# Patient Record
Sex: Female | Born: 1955
Health system: Southern US, Community
[De-identification: ages and names within clinical notes are randomized; demographics above are authoritative.]

## PROBLEM LIST (undated history)

## (undated) DIAGNOSIS — K08409 Partial loss of teeth, unspecified cause, unspecified class: Secondary | ICD-10-CM

## (undated) DIAGNOSIS — I8393 Asymptomatic varicose veins of bilateral lower extremities: Secondary | ICD-10-CM

## (undated) DIAGNOSIS — M8580 Other specified disorders of bone density and structure, unspecified site: Secondary | ICD-10-CM

## (undated) DIAGNOSIS — T7840XA Allergy, unspecified, initial encounter: Secondary | ICD-10-CM

## (undated) DIAGNOSIS — D689 Coagulation defect, unspecified: Secondary | ICD-10-CM

## (undated) DIAGNOSIS — Z9889 Other specified postprocedural states: Secondary | ICD-10-CM

## (undated) DIAGNOSIS — Z90710 Acquired absence of both cervix and uterus: Secondary | ICD-10-CM

## (undated) HISTORY — DX: Other specified disorders of bone density and structure, unspecified site: M85.80

## (undated) HISTORY — DX: Asymptomatic varicose veins of bilateral lower extremities: I83.93

## (undated) HISTORY — DX: Acquired absence of both cervix and uterus: Z90.710

## (undated) HISTORY — DX: Other specified postprocedural states: Z98.890

## (undated) HISTORY — DX: Coagulation defect, unspecified: D68.9

## (undated) HISTORY — DX: Allergy, unspecified, initial encounter: T78.40XA

## (undated) HISTORY — PX: POLYPECTOMY: SHX149

## (undated) HISTORY — DX: Partial loss of teeth, unspecified cause, unspecified class: K08.409

---
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
## 1997-09-08 HISTORY — PX: DILATION AND CURETTAGE OF UTERUS: SHX78

## 1997-12-22 ENCOUNTER — Other Ambulatory Visit: Admission: RE | Admit: 1997-12-22 | Discharge: 1997-12-22 | Payer: Self-pay | Admitting: Gynecology

## 1998-07-28 ENCOUNTER — Inpatient Hospital Stay (HOSPITAL_COMMUNITY): Admission: AD | Admit: 1998-07-28 | Discharge: 1998-07-28 | Payer: Self-pay | Admitting: Gynecology

## 1998-07-28 ENCOUNTER — Ambulatory Visit (HOSPITAL_COMMUNITY): Admission: RE | Admit: 1998-07-28 | Discharge: 1998-07-28 | Payer: Self-pay | Admitting: Gynecology

## 1999-07-24 ENCOUNTER — Encounter: Admission: RE | Admit: 1999-07-24 | Discharge: 1999-07-24 | Payer: Self-pay | Admitting: Gynecology

## 1999-07-24 ENCOUNTER — Encounter: Payer: Self-pay | Admitting: Gynecology

## 2000-08-10 ENCOUNTER — Other Ambulatory Visit: Admission: RE | Admit: 2000-08-10 | Discharge: 2000-08-10 | Payer: Self-pay | Admitting: Gynecology

## 2001-08-26 ENCOUNTER — Other Ambulatory Visit: Admission: RE | Admit: 2001-08-26 | Discharge: 2001-08-26 | Payer: Self-pay | Admitting: Gynecology

## 2002-06-23 ENCOUNTER — Ambulatory Visit (HOSPITAL_COMMUNITY): Admission: RE | Admit: 2002-06-23 | Discharge: 2002-06-23 | Payer: Self-pay | Admitting: Orthopedic Surgery

## 2002-06-23 ENCOUNTER — Encounter: Payer: Self-pay | Admitting: Orthopedic Surgery

## 2002-06-28 ENCOUNTER — Other Ambulatory Visit: Admission: RE | Admit: 2002-06-28 | Discharge: 2002-06-28 | Payer: Self-pay | Admitting: Gynecology

## 2003-07-19 ENCOUNTER — Other Ambulatory Visit: Admission: RE | Admit: 2003-07-19 | Discharge: 2003-07-19 | Payer: Self-pay | Admitting: Gynecology

## 2003-12-07 ENCOUNTER — Encounter: Admission: RE | Admit: 2003-12-07 | Discharge: 2003-12-07 | Payer: Self-pay | Admitting: Gynecology

## 2004-08-27 ENCOUNTER — Other Ambulatory Visit: Admission: RE | Admit: 2004-08-27 | Discharge: 2004-08-27 | Payer: Self-pay | Admitting: Gynecology

## 2005-04-02 ENCOUNTER — Other Ambulatory Visit: Admission: RE | Admit: 2005-04-02 | Discharge: 2005-04-02 | Payer: Self-pay | Admitting: Gynecology

## 2005-04-07 ENCOUNTER — Ambulatory Visit: Payer: Self-pay | Admitting: Internal Medicine

## 2005-04-18 ENCOUNTER — Encounter: Admission: RE | Admit: 2005-04-18 | Discharge: 2005-04-18 | Payer: Self-pay | Admitting: Gynecology

## 2005-04-24 ENCOUNTER — Ambulatory Visit: Payer: Self-pay | Admitting: Internal Medicine

## 2005-08-12 ENCOUNTER — Ambulatory Visit: Payer: Self-pay | Admitting: Hematology & Oncology

## 2005-09-08 HISTORY — PX: REFRACTIVE SURGERY: SHX103

## 2006-04-21 ENCOUNTER — Other Ambulatory Visit: Admission: RE | Admit: 2006-04-21 | Discharge: 2006-04-21 | Payer: Self-pay | Admitting: Gynecology

## 2006-04-28 ENCOUNTER — Encounter: Admission: RE | Admit: 2006-04-28 | Discharge: 2006-04-28 | Payer: Self-pay | Admitting: Gynecology

## 2006-12-08 ENCOUNTER — Ambulatory Visit: Payer: Self-pay | Admitting: Internal Medicine

## 2007-06-10 ENCOUNTER — Other Ambulatory Visit: Admission: RE | Admit: 2007-06-10 | Discharge: 2007-06-10 | Payer: Self-pay | Admitting: Gynecology

## 2007-06-30 ENCOUNTER — Encounter: Admission: RE | Admit: 2007-06-30 | Discharge: 2007-06-30 | Payer: Self-pay | Admitting: Gynecology

## 2007-07-06 ENCOUNTER — Encounter: Admission: RE | Admit: 2007-07-06 | Discharge: 2007-07-06 | Payer: Self-pay | Admitting: Gynecology

## 2007-12-28 ENCOUNTER — Ambulatory Visit: Payer: Self-pay | Admitting: Internal Medicine

## 2007-12-28 DIAGNOSIS — R209 Unspecified disturbances of skin sensation: Secondary | ICD-10-CM

## 2008-04-22 ENCOUNTER — Telehealth (INDEPENDENT_AMBULATORY_CARE_PROVIDER_SITE_OTHER): Payer: Self-pay | Admitting: *Deleted

## 2008-04-22 ENCOUNTER — Ambulatory Visit: Payer: Self-pay | Admitting: Family Medicine

## 2008-04-22 DIAGNOSIS — R509 Fever, unspecified: Secondary | ICD-10-CM

## 2008-04-22 LAB — CONVERTED CEMR LAB: Rapid Strep: NEGATIVE

## 2008-05-02 ENCOUNTER — Encounter: Admission: RE | Admit: 2008-05-02 | Discharge: 2008-05-02 | Payer: Self-pay | Admitting: Family Medicine

## 2008-06-30 ENCOUNTER — Encounter: Admission: RE | Admit: 2008-06-30 | Discharge: 2008-06-30 | Payer: Self-pay | Admitting: Gynecology

## 2008-07-03 ENCOUNTER — Other Ambulatory Visit: Admission: RE | Admit: 2008-07-03 | Discharge: 2008-07-03 | Payer: Self-pay | Admitting: Gynecology

## 2008-07-09 HISTORY — PX: COLONOSCOPY: SHX174

## 2009-05-16 ENCOUNTER — Encounter: Payer: Self-pay | Admitting: Internal Medicine

## 2009-07-04 ENCOUNTER — Encounter (INDEPENDENT_AMBULATORY_CARE_PROVIDER_SITE_OTHER): Payer: Self-pay | Admitting: *Deleted

## 2009-11-27 ENCOUNTER — Encounter: Admission: RE | Admit: 2009-11-27 | Discharge: 2009-11-27 | Payer: Self-pay | Admitting: Obstetrics and Gynecology

## 2010-06-04 ENCOUNTER — Ambulatory Visit: Payer: Self-pay | Admitting: Family Medicine

## 2010-06-04 DIAGNOSIS — J069 Acute upper respiratory infection, unspecified: Secondary | ICD-10-CM | POA: Insufficient documentation

## 2010-10-10 NOTE — Assessment & Plan Note (Signed)
Summary: FLU LIKE SYMPTOMS//SLM   Vital Signs:  Patient profile:   55 year old female Weight:      128 pounds Temp:     98.6 degrees F oral BP sitting:   92 / 62  (left arm) Cuff size:   regular  Vitals Entered By: Sid Falcon LPN (June 04, 2010 1:41 PM)  History of Present Illness: Same-day appointment. Patient seen with onset of illness about 4 days ago. Initially developed some dull headaches. Subsequently developed body aches, question low-grade fever, sore throat, dry cough, and nasal congestion. Past few days colored nasal discharge. Took  Allerx without much improvement. Nonsmoker.  Allergies (verified): 1)  Tamiflu (Oseltamivir Phosphate)  Past History:  Past Medical History: Last updated: 04/28/2007 Facial erythema infertility rosasea PMH reviewed for relevance  Review of Systems      See HPI  Physical Exam  General:  Well-developed,well-nourished,in no acute distress; alert,appropriate and cooperative throughout examination Eyes:  pupils equal, pupils round, and pupils reactive to light.   Ears:  T tubes in place with no drainage. Mouth:  Oral mucosa and oropharynx without lesions or exudates.  Teeth in good repair. Neck:  No deformities, masses, or tenderness noted. Lungs:  Normal respiratory effort, chest expands symmetrically. Lungs are clear to auscultation, no crackles or wheezes. Heart:  Normal rate and regular rhythm. S1 and S2 normal without gallop, murmur, click, rub or other extra sounds.   Impression & Recommendations:  Problem # 1:  UPPER RESPIRATORY INFECTION (ICD-465.9) Suspect viral .   Recommended observation this time.   Wrote prescription for Omnicef to start only if she develops increasing facial pain or purulent nasal discharge or not improving in the next week or 10 days Her updated medication list for this problem includes:    Aspirin 81 Mg Tbec (Aspirin) ..... Once daily  Complete Medication List: 1)  Aspirin 81 Mg Tbec  (Aspirin) .... Once daily 2)  Cefdinir 300 Mg Caps (Cefdinir) .... One by mouth two times a day for 10 days  Patient Instructions: 1)  Acute sinusitis symptoms for less than 10 days are not helped by antibiotics. Use warm moist compresses, and over the counter decongestants( only as directed). Call if no improvement in 5-7 days, sooner if increasing pain, fever, or new symptoms.  Prescriptions: CEFDINIR 300 MG CAPS (CEFDINIR) one by mouth two times a day for 10 days  #20 x 0   Entered and Authorized by:   Evelena Peat MD   Signed by:   Evelena Peat MD on 06/04/2010   Method used:   Print then Give to Patient   RxID:   [id number]

## 2010-12-16 ENCOUNTER — Other Ambulatory Visit: Payer: Self-pay | Admitting: Obstetrics and Gynecology

## 2010-12-16 DIAGNOSIS — Z1231 Encounter for screening mammogram for malignant neoplasm of breast: Secondary | ICD-10-CM

## 2010-12-19 ENCOUNTER — Ambulatory Visit
Admission: RE | Admit: 2010-12-19 | Discharge: 2010-12-19 | Disposition: A | Discharge status: Home / Self Care | Payer: Self-pay | Source: Ambulatory Visit | Attending: Obstetrics and Gynecology | Admitting: Obstetrics and Gynecology

## 2010-12-19 DIAGNOSIS — Z1231 Encounter for screening mammogram for malignant neoplasm of breast: Secondary | ICD-10-CM

## 2010-12-20 ENCOUNTER — Other Ambulatory Visit: Payer: Self-pay | Admitting: Family Medicine

## 2010-12-25 ENCOUNTER — Other Ambulatory Visit: Payer: BLUE CROSS/BLUE SHIELD

## 2010-12-27 ENCOUNTER — Ambulatory Visit
Admission: RE | Admit: 2010-12-27 | Discharge: 2010-12-27 | Disposition: A | Discharge status: Home / Self Care | Payer: BLUE CROSS/BLUE SHIELD | Source: Ambulatory Visit | Attending: Family Medicine | Admitting: Family Medicine

## 2012-06-03 ENCOUNTER — Other Ambulatory Visit: Payer: Self-pay | Admitting: Obstetrics and Gynecology

## 2012-06-03 DIAGNOSIS — Z1231 Encounter for screening mammogram for malignant neoplasm of breast: Secondary | ICD-10-CM

## 2012-07-05 ENCOUNTER — Ambulatory Visit
Admission: RE | Admit: 2012-07-05 | Discharge: 2012-07-05 | Disposition: A | Discharge status: Home / Self Care | Payer: BC Managed Care – PPO | Source: Ambulatory Visit | Attending: Obstetrics and Gynecology | Admitting: Obstetrics and Gynecology

## 2012-07-05 ENCOUNTER — Other Ambulatory Visit: Payer: Self-pay | Admitting: Dermatology

## 2012-07-05 DIAGNOSIS — Z1231 Encounter for screening mammogram for malignant neoplasm of breast: Secondary | ICD-10-CM

## 2013-03-12 ENCOUNTER — Telehealth: Payer: Self-pay | Admitting: Internal Medicine

## 2013-03-12 NOTE — Telephone Encounter (Signed)
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  Patient states she has a sinus infection and would like for something to be called into the [facility], [address] [place] [phone]). Patient can be reached on her cell [phone]. Please advise. Thank you

## 2013-03-14 ENCOUNTER — Telehealth: Payer: Self-pay | Admitting: Internal Medicine

## 2013-03-14 NOTE — Telephone Encounter (Signed)
[facility] Triage Call Report Triage Record Num: [id number] Operator: Lesli Albee Patient Name: Stacy Baker Call Date & Time: 03/12/2013 2:06:46PM Patient Phone: [phone] PCP: Patient Gender: Female PCP Fax : Patient DOB: Mar 04, 1956 Practice Name: [facility] Reason for Call: Caller: [name]; PCP: Birdie Sons (Adults only); CB#: [phone]; Call regarding Sinus infection; Pt is out of town /she had called earlier and requested abx for a sinus infection. Pt has been waiting for an answer. RN checked EPIC and saw request/no answer. Per EPIC it looks like th pt has not been seen in 4-5 years. Pt states that she usually is treated by MD at [facility] but she will not see him until MOnday. Pt will use a neti-pot until then. Protocol(s) Used: Information Only Call; No Symptom Triage (Adult) Recommended Outcome per Protocol: Provide Information or Advice Only Reason for Outcome: Requesting information regarding scheduled exam, test or procedure; no triage required. Information provided from approved resources or clinical experience. Care Advice: ~ 07/

## 2013-03-16 NOTE — Telephone Encounter (Signed)
Pt seen another physician and was dx with allergies

## 2013-03-16 NOTE — Telephone Encounter (Signed)
It's unlikely that she has a sinus infection. If she develops a fever or sinus discomfort lasting 10-14 days she should seek medical attention. Otherwise she will probably be fine taking Robitussin-DM and nasal saline spray.

## 2013-06-06 ENCOUNTER — Other Ambulatory Visit: Payer: Self-pay

## 2013-06-06 DIAGNOSIS — Z1231 Encounter for screening mammogram for malignant neoplasm of breast: Secondary | ICD-10-CM

## 2013-06-07 ENCOUNTER — Other Ambulatory Visit: Payer: Self-pay | Admitting: Obstetrics and Gynecology

## 2013-07-06 ENCOUNTER — Ambulatory Visit
Admission: RE | Admit: 2013-07-06 | Discharge: 2013-07-06 | Disposition: A | Discharge status: Home / Self Care | Payer: BC Managed Care – PPO | Source: Ambulatory Visit

## 2013-07-06 DIAGNOSIS — Z1231 Encounter for screening mammogram for malignant neoplasm of breast: Secondary | ICD-10-CM

## 2013-07-08 ENCOUNTER — Other Ambulatory Visit: Payer: Self-pay | Admitting: Obstetrics and Gynecology

## 2013-07-08 DIAGNOSIS — R928 Other abnormal and inconclusive findings on diagnostic imaging of breast: Secondary | ICD-10-CM

## 2013-07-11 ENCOUNTER — Ambulatory Visit
Admission: RE | Admit: 2013-07-11 | Discharge: 2013-07-11 | Disposition: A | Discharge status: Home / Self Care | Payer: BC Managed Care – PPO | Source: Ambulatory Visit | Attending: Obstetrics and Gynecology | Admitting: Obstetrics and Gynecology

## 2013-07-11 DIAGNOSIS — R928 Other abnormal and inconclusive findings on diagnostic imaging of breast: Secondary | ICD-10-CM

## 2013-07-19 ENCOUNTER — Encounter: Payer: Self-pay | Admitting: Gastroenterology

## 2013-09-06 ENCOUNTER — Ambulatory Visit (AMBULATORY_SURGERY_CENTER): Payer: Self-pay | Admitting: *Deleted

## 2013-09-06 VITALS — Ht 68.0 in | Wt 128.8 lb

## 2013-09-06 DIAGNOSIS — Z86010 Personal history of colonic polyps: Secondary | ICD-10-CM

## 2013-09-06 MED ORDER — MOVIPREP 100 G PO SOLR: ORAL | Status: DC

## 2013-09-06 NOTE — Progress Notes (Signed)
No allergies to eggs or soy. No problems with anesthesia.  

## 2013-09-07 ENCOUNTER — Telehealth: Payer: Self-pay | Admitting: *Deleted

## 2013-09-07 NOTE — Telephone Encounter (Signed)
Pt scheduled for colonoscopy 1/14 with Dr. Christella Hartigan.  Last colonoscopy 5 years ago with Dr. Kinnie Scales.  Pt says she had polyps and was told to have colonoscopy in 5 years.  Release of information form signed and given to Chales Abrahams, CMA.

## 2013-09-07 NOTE — Telephone Encounter (Signed)
Release faxed

## 2013-09-13 ENCOUNTER — Telehealth: Payer: Self-pay | Admitting: Gastroenterology

## 2013-09-13 NOTE — Telephone Encounter (Signed)
Pt was notified and recall in EPIC pt will call if she has any complaints before the recall is due

## 2013-09-13 NOTE — Telephone Encounter (Signed)
Left message on machine to call back  

## 2013-09-13 NOTE — Telephone Encounter (Signed)
Colonoscopy Dr. Kinnie ScalesMedoff 07/2008, done for "intermittent rectal bleeding, constipation."  Prep was "excellent," exam to cecum.  Findings: diverticulosis, small vascular structure, hemorrhoids.  No polyps were found.  He recommended repeat colonoscopy in 5 years for unclear reasons. She probably does NOT need colonoscopy until 07/2018 since she had no polyps during last colonoscopy.  Not sure why he recommended sooner colonoscopy.  Please cancel her upcoming colonoscopy (next week) and offer her NGI appt with me to discuss if she has any questions or concerns at all about these recommendations.  Maybe she has a family history of colon cancer (1st degree relative?).

## 2013-09-21 ENCOUNTER — Other Ambulatory Visit: Payer: BC Managed Care – PPO | Admitting: Gastroenterology

## 2014-03-30 ENCOUNTER — Telehealth: Payer: Self-pay | Admitting: Internal Medicine

## 2014-03-30 NOTE — Telephone Encounter (Signed)
Pt would like to know if dr. Fabian Sharppanosh would accept her as a new pt, pt states her daughter is a pt of dr. Fabian SharpPanosh. Pt had dr. Cato MulliganSwords listed as her primary doctor but has actually never seen him before. Pt was using her OBGYN for everything.

## 2014-03-30 NOTE — Telephone Encounter (Signed)
Ok to transfer  I believe her daughter is Presenter, broadcastinghelen Latour.

## 2014-04-03 NOTE — Telephone Encounter (Signed)
Pt aware, will call back later to schedule appt.

## 2014-08-15 ENCOUNTER — Other Ambulatory Visit: Payer: Self-pay | Admitting: Obstetrics and Gynecology

## 2014-08-16 LAB — CYTOLOGY - PAP

## 2014-11-15 ENCOUNTER — Other Ambulatory Visit: Payer: Self-pay

## 2014-11-15 DIAGNOSIS — Z1231 Encounter for screening mammogram for malignant neoplasm of breast: Secondary | ICD-10-CM

## 2014-11-17 ENCOUNTER — Ambulatory Visit
Admission: RE | Admit: 2014-11-17 | Discharge: 2014-11-17 | Disposition: A | Discharge status: Home / Self Care | Payer: BLUE CROSS/BLUE SHIELD | Source: Ambulatory Visit

## 2014-11-17 DIAGNOSIS — Z1231 Encounter for screening mammogram for malignant neoplasm of breast: Secondary | ICD-10-CM

## 2014-11-21 ENCOUNTER — Other Ambulatory Visit: Payer: Self-pay | Admitting: Obstetrics and Gynecology

## 2014-11-21 DIAGNOSIS — R928 Other abnormal and inconclusive findings on diagnostic imaging of breast: Secondary | ICD-10-CM

## 2014-11-22 ENCOUNTER — Ambulatory Visit
Admission: RE | Admit: 2014-11-22 | Discharge: 2014-11-22 | Disposition: A | Discharge status: Home / Self Care | Payer: BLUE CROSS/BLUE SHIELD | Source: Ambulatory Visit | Attending: Obstetrics and Gynecology | Admitting: Obstetrics and Gynecology

## 2014-11-22 DIAGNOSIS — R928 Other abnormal and inconclusive findings on diagnostic imaging of breast: Secondary | ICD-10-CM

## 2014-11-23 ENCOUNTER — Other Ambulatory Visit: Payer: BLUE CROSS/BLUE SHIELD

## 2015-11-06 ENCOUNTER — Other Ambulatory Visit: Payer: Self-pay

## 2015-11-06 DIAGNOSIS — Z1231 Encounter for screening mammogram for malignant neoplasm of breast: Secondary | ICD-10-CM

## 2015-11-26 ENCOUNTER — Ambulatory Visit
Admission: RE | Admit: 2015-11-26 | Discharge: 2015-11-26 | Disposition: A | Discharge status: Home / Self Care | Payer: BLUE CROSS/BLUE SHIELD | Source: Ambulatory Visit

## 2015-11-26 DIAGNOSIS — Z1231 Encounter for screening mammogram for malignant neoplasm of breast: Secondary | ICD-10-CM

## 2016-10-22 ENCOUNTER — Other Ambulatory Visit: Payer: Self-pay | Admitting: Obstetrics and Gynecology

## 2016-10-22 DIAGNOSIS — Z1231 Encounter for screening mammogram for malignant neoplasm of breast: Secondary | ICD-10-CM

## 2016-11-27 ENCOUNTER — Ambulatory Visit
Admission: RE | Admit: 2016-11-27 | Discharge: 2016-11-27 | Disposition: A | Discharge status: Home / Self Care | Payer: BLUE CROSS/BLUE SHIELD | Source: Ambulatory Visit | Attending: Obstetrics and Gynecology | Admitting: Obstetrics and Gynecology

## 2016-11-27 DIAGNOSIS — Z1231 Encounter for screening mammogram for malignant neoplasm of breast: Secondary | ICD-10-CM | POA: Diagnosis not present

## 2016-12-31 DIAGNOSIS — L814 Other melanin hyperpigmentation: Secondary | ICD-10-CM | POA: Diagnosis not present

## 2016-12-31 DIAGNOSIS — L700 Acne vulgaris: Secondary | ICD-10-CM | POA: Diagnosis not present

## 2017-06-03 DIAGNOSIS — H524 Presbyopia: Secondary | ICD-10-CM | POA: Diagnosis not present

## 2017-06-03 DIAGNOSIS — H04123 Dry eye syndrome of bilateral lacrimal glands: Secondary | ICD-10-CM | POA: Diagnosis not present

## 2017-07-13 DIAGNOSIS — H02831 Dermatochalasis of right upper eyelid: Secondary | ICD-10-CM | POA: Diagnosis not present

## 2017-07-13 DIAGNOSIS — H02834 Dermatochalasis of left upper eyelid: Secondary | ICD-10-CM | POA: Diagnosis not present

## 2017-07-13 DIAGNOSIS — H57813 Brow ptosis, bilateral: Secondary | ICD-10-CM | POA: Diagnosis not present

## 2017-08-27 DIAGNOSIS — Z01419 Encounter for gynecological examination (general) (routine) without abnormal findings: Secondary | ICD-10-CM | POA: Diagnosis not present

## 2017-08-27 DIAGNOSIS — Z681 Body mass index (BMI) 19 or less, adult: Secondary | ICD-10-CM | POA: Diagnosis not present

## 2018-01-07 ENCOUNTER — Other Ambulatory Visit: Payer: Self-pay | Admitting: Obstetrics and Gynecology

## 2018-01-07 DIAGNOSIS — Z1231 Encounter for screening mammogram for malignant neoplasm of breast: Secondary | ICD-10-CM

## 2018-01-28 ENCOUNTER — Ambulatory Visit
Admission: RE | Admit: 2018-01-28 | Discharge: 2018-01-28 | Disposition: A | Discharge status: Home / Self Care | Payer: Managed Care, Other (non HMO) | Source: Ambulatory Visit | Attending: Obstetrics and Gynecology | Admitting: Obstetrics and Gynecology

## 2018-01-28 DIAGNOSIS — Z1231 Encounter for screening mammogram for malignant neoplasm of breast: Secondary | ICD-10-CM

## 2018-06-08 DIAGNOSIS — I8312 Varicose veins of left lower extremity with inflammation: Secondary | ICD-10-CM | POA: Diagnosis not present

## 2018-06-11 DIAGNOSIS — H16041 Marginal corneal ulcer, right eye: Secondary | ICD-10-CM | POA: Diagnosis not present

## 2018-06-11 DIAGNOSIS — H524 Presbyopia: Secondary | ICD-10-CM | POA: Diagnosis not present

## 2018-06-11 DIAGNOSIS — H02403 Unspecified ptosis of bilateral eyelids: Secondary | ICD-10-CM | POA: Diagnosis not present

## 2018-06-22 DIAGNOSIS — H04121 Dry eye syndrome of right lacrimal gland: Secondary | ICD-10-CM | POA: Diagnosis not present

## 2018-06-22 DIAGNOSIS — H16041 Marginal corneal ulcer, right eye: Secondary | ICD-10-CM | POA: Diagnosis not present

## 2018-08-10 ENCOUNTER — Encounter: Payer: Self-pay | Admitting: Gastroenterology

## 2018-08-10 ENCOUNTER — Telehealth: Payer: Self-pay

## 2018-08-10 NOTE — Telephone Encounter (Signed)
Copied from CRM [id number]. Topic: Appointment Scheduling - Scheduling Inquiry for Clinic >> Aug 10, 2018  9:08 AM Leafy Roobinson, Stacy Baker wrote: Reason for CRM:pt husband sees dr hunter Uvaldo Risingarnold Alberg and he recommends his wife Stacy Baker who is 62 years old to get est with dr Therapist, nutritionalhunter. I did inform Stacy Baker dr hunter is not accepting new pt over 60 years old. Pt husband saw dr hunter yesterday for a physical. Stacy Baker has no chronic medical condition

## 2018-08-10 NOTE — Telephone Encounter (Signed)
I am willing to see her as a family member of current patient

## 2018-08-11 NOTE — Telephone Encounter (Signed)
Please contact to schedule TOC appointment °

## 2018-08-11 NOTE — Telephone Encounter (Signed)
Called Patient LVM to call back and schedule TOC appt.

## 2018-08-17 ENCOUNTER — Encounter: Payer: Self-pay | Admitting: Gastroenterology

## 2018-08-17 ENCOUNTER — Ambulatory Visit (AMBULATORY_SURGERY_CENTER): Payer: Self-pay | Admitting: *Deleted

## 2018-08-17 ENCOUNTER — Other Ambulatory Visit: Payer: Self-pay

## 2018-08-17 VITALS — Ht 68.0 in | Wt 129.2 lb

## 2018-08-17 DIAGNOSIS — Z86010 Personal history of colonic polyps: Secondary | ICD-10-CM

## 2018-08-17 MED ORDER — PEG 3350-KCL-NA BICARB-NACL 420 G PO SOLR: 4000.0000 mL | Freq: Once | ORAL | 0 refills | Status: AC

## 2018-08-17 NOTE — Progress Notes (Signed)
Patient denies any allergies to egg or soy products. Patient denies complications with anesthesia/sedation.  Patient denies oxygen use at home and denies diet medications. Pamphlet given on colonoscopy. 

## 2018-08-25 ENCOUNTER — Encounter: Payer: BLUE CROSS/BLUE SHIELD | Admitting: Gastroenterology

## 2018-08-29 ENCOUNTER — Encounter: Payer: Self-pay | Admitting: Gastroenterology

## 2018-09-17 ENCOUNTER — Ambulatory Visit: Payer: BLUE CROSS/BLUE SHIELD | Admitting: Family Medicine

## 2018-09-17 ENCOUNTER — Telehealth: Payer: Self-pay | Admitting: Family Medicine

## 2018-09-17 ENCOUNTER — Encounter: Payer: Self-pay | Admitting: Family Medicine

## 2018-09-17 VITALS — BP 104/69 | HR 55 | Temp 97.5°F | Ht 68.0 in | Wt 126.4 lb

## 2018-09-17 DIAGNOSIS — Z0000 Encounter for general adult medical examination without abnormal findings: Secondary | ICD-10-CM

## 2018-09-17 DIAGNOSIS — D689 Coagulation defect, unspecified: Secondary | ICD-10-CM | POA: Insufficient documentation

## 2018-09-17 DIAGNOSIS — R209 Unspecified disturbances of skin sensation: Secondary | ICD-10-CM | POA: Diagnosis not present

## 2018-09-17 DIAGNOSIS — D6859 Other primary thrombophilia: Secondary | ICD-10-CM | POA: Insufficient documentation

## 2018-09-17 DIAGNOSIS — Z13220 Encounter for screening for lipoid disorders: Secondary | ICD-10-CM

## 2018-09-17 DIAGNOSIS — R7989 Other specified abnormal findings of blood chemistry: Secondary | ICD-10-CM | POA: Diagnosis not present

## 2018-09-17 NOTE — Progress Notes (Signed)
Phone: [phone]  Subjective:  Patient presents today for their annual physical and to transfer care-prior patient of Dr. Cato MulliganSwords. Chief complaint-noted.   See problem oriented charting- ROS- full  review of systems was completed and negative except for:  Nail split left thumb  The following were reviewed and entered/updated in epic: Past Medical History:  Diagnosis Date  . Allergy    Tamiflu   . History of D&C    miscarriage 2001  . History of laparoscopy-assisted vaginal hysterectomy   . Hx of LASIK    bilateral  . Varicose veins of both lower extremities    veins injected bilateral  . Wisdom teeth extracted    Patient Active Problem List   Diagnosis Date Noted  . Protein S deficiency (HCC) 09/17/2018    Priority: Medium  . PARESTHESIA 12/28/2007    Priority: Low   Past Surgical History:  Procedure Laterality Date  . COLONOSCOPY  07/2008   polyps  . DILATION AND CURETTAGE OF UTERUS  1999   miscarriage  . REFRACTIVE SURGERY Bilateral 2007   Lasik  . URETHRAL STRICTURE DILATATION  1978   in high school     Family History  Problem Relation Age of Onset  . Prostate cancer Father        robotic surgery and free 10 years in 2020  . Stomach cancer Maternal Uncle   . Fibromyalgia Mother   . Arthritis Mother   . Hyperlipidemia Mother   . Hypertension Mother        may be father though  . Hemochromatosis Mother   . Healthy Sister   . Non-Hodgkin's lymphoma Brother        doing well with treatment  . Clotting disorder Maternal Grandfather   . Deep vein thrombosis Brother        didnt dissolve- possible IVC filter  . Glaucoma Paternal Grandmother   . Colon cancer Neg Hx   . Rectal cancer Neg Hx     Medications- reviewed and updated Current Outpatient Medications  Medication Sig Dispense Refill  . aspirin 81 MG tablet Take 81 mg by mouth daily.    . cycloSPORINE (RESTASIS) 0.05 % ophthalmic emulsion Place 1 drop into both eyes 2 (two) times daily.    Marland Kitchen.  DIVIGEL 1 MG/GM GEL   0  . Multiple Vitamin (MULTIVITAMIN) tablet Take 1 tablet by mouth daily.    . Omega-3 Fatty Acids (FISH OIL) 1200 MG CAPS Take by mouth daily.    . Probiotic Product (PHILLIPS COLON HEALTH PO) Take by mouth daily.    . Psyllium (METAMUCIL FIBER PO) Take 1 each by mouth daily as needed. 1 tbs daily      Allergies-reviewed and updated Allergies  Allergen Reactions  . Oseltamivir Phosphate     REACTION: hives    Social History   Social History Narrative   Married with 61 year old adopted daughter Myriam JacobsonHelen in January 2020      [employer]is[employer] - now  Semi retired - working 1 day a week. Worked in HR.       Hobbies: exercise, may volunteer at the hospital, travel, volunteer work at [employer]     Objective: BP 104/69 (BP Location: Left Arm, Patient Position: Sitting, Cuff Size: Large)   Pulse (!) 55   Temp (!) 97.5 F (36.4 C) (Oral)   Ht 5\' 8"  (1.727 m)   Wt 126 lb 6.4 oz (57.3 kg)   LMP  (LMP Unknown)   SpO2 99%  BMI 19.22 kg/m  Gen: NAD, resting comfortably HEENT: Mucous membranes are moist. Oropharynx normal Neck: no thyromegaly CV: RRR no murmurs rubs or gallops Lungs: CTAB no crackles, wheeze, rhonchi Abdomen: soft/nontender/nondistended/normal bowel sounds. No rebound or guarding.  Ext: no edema Skin: warm, dry Neuro: grossly normal, moves all extremities, PERRLA   Assessment/Plan:  63 y.o. female presenting for annual physical.  Health Maintenance counseling: 1. Anticipatory guidance: Patient counseled regarding regular dental exams -q6 months, eye exams - yearly,  avoiding smoking and second hand smoke , limiting alcohol to 1 beverage per day .   2. Risk factor reduction:  Advised patient of need for regular exercise and diet rich and fruits and vegetables to reduce risk of heart attack and stroke. Exercise- regular exercise 4 days a week. Diet-Reasonably healthy- no beef or pork.  Wt Readings from Last 3 Encounters:  09/17/18 126 lb 6.4 oz  (57.3 kg)  08/17/18 129 lb 3.2 oz (58.6 kg)  09/06/13 128 lb 12.8 oz (58.4 kg)  3. Immunizations/screenings/ancillary studies- Declines shingrix immunization for now- will consider next year. Has had shingles in the past.  Immunization History  Administered Date(s) Administered  . Influenza,inj,Quad PF,6+ Mos 06/11/2018  . Tdap 06/24/2017  4. Cervical cancer screening-  We are going to get records from GYN- Asher Muir is requestiong 5. Breast cancer screening-  breast exam with GYn and mammogram 01/28/18 6. Colon cancer screening - scheduled later this month for 2nd colonoscopy 7. Skin cancer screening- Dr. Amy Swaziland sees yearly. advised regular sunscreen use. Denies worrisome, changing, or new skin lesions.  8. Birth control/STD check- hysterectomy and monogomous 9. Osteoporosis screening at 40- had earlier screening and was told normal- we can consider repeat at 67  10. never smoker  Status of chronic or acute concerns  History of low vitamin D- stopped taking supplement 6 months ago - we will reevaluate today  History low TSH on syngenta bloodwork- will reassess today- if low may check T3 or T4 levels or do endocrinology referral   Her and father have painful left thumbnail with vertical slice in it- does not go down to nail bed- can be painful. Biotin did not help- has been off for 6 months.   No issues with liquids but has some difficulty swallowing solids at times- choking sensation. She is going to ask Dr. Christella Hartigan about that- may end up with EGD  No problem-specific Assessment & Plan notes found for this encounter.   Future Appointments  Date Time Provider Department Center  09/21/2018  8:30 AM [facility]  09/29/2018  4:00 PM Rachael Fee, MD [facility]  09/21/2019  9:20 AM Durene Cal, Aldine Contes, MD [facility]   1 year CPE planned  Lab/Order associations: NOT fasting Preventative health care - Plan: CBC, Comprehensive metabolic panel, Lipid panel, TSH,  VITAMIN D 25 Hydroxy (Vit-D Deficiency, Fractures)  PARESTHESIA - Plan: CBC, Comprehensive metabolic panel  Protein S deficiency (HCC)  Low TSH level - Plan: TSH  Low vitamin D level - Plan: VITAMIN D 25 Hydroxy (Vit-D Deficiency, Fractures)  Screening for hyperlipidemia - Plan: Lipid panel  Return precautions advised.  Tana Conch, MD

## 2018-09-17 NOTE — Patient Instructions (Addendum)
Health Maintenance Due  Topic Date Due  . PAP SMEAR-Modifier -called to get report sent over 08/15/2017  . COLONOSCOPY -scheduled 09/29/2018 07/28/2018   Schedule a lab visit at the check out desk within 2 weeks. Return for future fasting labs meaning nothing but water after midnight please. Ok to take your medications with water.   I think you are doing a great job taking care of yourself!

## 2018-09-17 NOTE — Telephone Encounter (Signed)
Copied from CRM [id number]. Topic: Quick Communication - See Telephone Encounter >> Sep 17, 2018  4:23 PM Jens Som A wrote: CRM for notification. See Telephone encounter for: 09/17/18.  Patient is calling back for Stacy Baker she would like to get the shingles inj on 09/22/18 at the same time as lab work.  I s cologard an option instead of colonoscopy? Please advise  [phone] ok to leave message on VM

## 2018-09-17 NOTE — Telephone Encounter (Signed)
See note

## 2018-09-20 ENCOUNTER — Encounter: Payer: Self-pay | Admitting: Family Medicine

## 2018-09-21 ENCOUNTER — Ambulatory Visit (INDEPENDENT_AMBULATORY_CARE_PROVIDER_SITE_OTHER): Payer: BLUE CROSS/BLUE SHIELD

## 2018-09-21 ENCOUNTER — Other Ambulatory Visit (INDEPENDENT_AMBULATORY_CARE_PROVIDER_SITE_OTHER): Payer: BLUE CROSS/BLUE SHIELD

## 2018-09-21 DIAGNOSIS — Z13220 Encounter for screening for lipoid disorders: Secondary | ICD-10-CM | POA: Diagnosis not present

## 2018-09-21 DIAGNOSIS — R209 Unspecified disturbances of skin sensation: Secondary | ICD-10-CM | POA: Diagnosis not present

## 2018-09-21 DIAGNOSIS — Z23 Encounter for immunization: Secondary | ICD-10-CM

## 2018-09-21 DIAGNOSIS — Z0000 Encounter for general adult medical examination without abnormal findings: Secondary | ICD-10-CM | POA: Diagnosis not present

## 2018-09-21 DIAGNOSIS — R7989 Other specified abnormal findings of blood chemistry: Secondary | ICD-10-CM | POA: Diagnosis not present

## 2018-09-21 LAB — COMPREHENSIVE METABOLIC PANEL
ALBUMIN: 4.4 g/dL (ref 3.5–5.2)
ALT: 14 U/L (ref 0–35)
AST: 18 U/L (ref 0–37)
Alkaline Phosphatase: 42 U/L (ref 39–117)
BUN: 10 mg/dL (ref 6–23)
CALCIUM: 9.3 mg/dL (ref 8.4–10.5)
CO2: 28 mEq/L (ref 19–32)
CREATININE: 0.78 mg/dL (ref 0.40–1.20)
Chloride: 102 mEq/L (ref 96–112)
GFR: 80.03 mL/min (ref >60.00)
Glucose, Bld: 92 mg/dL (ref 70–99)
Potassium: 4.1 mEq/L (ref 3.5–5.1)
Sodium: 136 mEq/L (ref 135–145)
Total Bilirubin: 0.6 mg/dL (ref 0.2–1.2)
Total Protein: 6.5 g/dL (ref 6.0–8.3)

## 2018-09-21 LAB — CBC
HCT: 41.8 % (ref 36.0–46.0)
Hemoglobin: 14.2 g/dL (ref 12.0–15.0)
MCHC: 34 g/dL (ref 30.0–36.0)
MCV: 92.3 fl (ref 78.0–100.0)
Platelets: 151 10*3/uL (ref 150.0–400.0)
RBC: 4.52 Mil/uL (ref 3.87–5.11)
RDW: 13.4 % (ref 11.5–15.5)
WBC: 4.5 10*3/uL (ref 4.0–10.5)

## 2018-09-21 LAB — LIPID PANEL
CHOLESTEROL: 189 mg/dL (ref 0–200)
HDL: 79 mg/dL (ref >39.00)
LDL Cholesterol: 97 mg/dL (ref 0–99)
NonHDL: 109.67
TRIGLYCERIDES: 63 mg/dL (ref 0.0–149.0)
Total CHOL/HDL Ratio: 2
VLDL: 12.6 mg/dL (ref 0.0–40.0)

## 2018-09-21 LAB — TSH: TSH: 2.37 u[IU]/mL (ref 0.35–4.50)

## 2018-09-21 LAB — VITAMIN D 25 HYDROXY (VIT D DEFICIENCY, FRACTURES): VITD: 33.37 ng/mL (ref 30.00–100.00)

## 2018-09-21 NOTE — Patient Instructions (Signed)
Health Maintenance Due  Topic Date Due  . PAP SMEAR-Modifier  08/15/2017  . COLONOSCOPY  07/28/2018    Depression screen PHQ 2/9 09/17/2018  Decreased Interest 0  Down, Depressed, Hopeless 0  PHQ - 2 Score 0

## 2018-09-21 NOTE — Progress Notes (Signed)
Patient here today for her first Shingrix vaccine. Administered in her right arm. VIS given. Tolerated well

## 2018-09-22 NOTE — Telephone Encounter (Signed)
Shingles vaccine received 09/21/2018.

## 2018-09-29 ENCOUNTER — Encounter: Payer: Self-pay | Admitting: Gastroenterology

## 2018-09-29 ENCOUNTER — Ambulatory Visit (AMBULATORY_SURGERY_CENTER): Payer: BLUE CROSS/BLUE SHIELD | Admitting: Gastroenterology

## 2018-09-29 VITALS — BP 98/38 | HR 61 | Temp 98.4°F | Resp 13 | Ht 68.0 in | Wt 126.4 lb

## 2018-09-29 DIAGNOSIS — Z1211 Encounter for screening for malignant neoplasm of colon: Secondary | ICD-10-CM

## 2018-09-29 DIAGNOSIS — Z86010 Personal history of colonic polyps: Secondary | ICD-10-CM | POA: Diagnosis not present

## 2018-09-29 MED ORDER — SODIUM CHLORIDE 0.9 % IV SOLN: 500.0000 mL | Freq: Once | INTRAVENOUS | Status: DC

## 2018-09-29 NOTE — Progress Notes (Signed)
Pt's states no medical or surgical changes since previsit or office visit. 

## 2018-09-29 NOTE — Patient Instructions (Signed)
Discharge instructions given. Normal exam. Resume previous medications. YOU HAD AN ENDOSCOPIC PROCEDURE TODAY AT THE [facility]:   Refer to the procedure report that was given to you for any specific questions about what was found during the examination.  If the procedure report does not answer your questions, please call your gastroenterologist to clarify.  If you requested that your care partner not be given the details of your procedure findings, then the procedure report has been included in a sealed envelope for you to review at your convenience later.  YOU SHOULD EXPECT: Some feelings of bloating in the abdomen. Passage of more gas than usual.  Walking can help get rid of the air that was put into your GI tract during the procedure and reduce the bloating. If you had a lower endoscopy (such as a colonoscopy or flexible sigmoidoscopy) you may notice spotting of blood in your stool or on the toilet paper. If you underwent a bowel prep for your procedure, you may not have a normal bowel movement for a few days.  Please Note:  You might notice some irritation and congestion in your nose or some drainage.  This is from the oxygen used during your procedure.  There is no need for concern and it should clear up in a day or so.  SYMPTOMS TO REPORT IMMEDIATELY:   Following lower endoscopy (colonoscopy or flexible sigmoidoscopy):  Excessive amounts of blood in the stool  Significant tenderness or worsening of abdominal pains  Swelling of the abdomen that is new, acute  Fever of 100F or higher   For urgent or emergent issues, a gastroenterologist can be reached at any hour by calling [phone].   DIET:  We do recommend a small meal at first, but then you may proceed to your regular diet.  Drink plenty of fluids but you should avoid alcoholic beverages for 24 hours.  ACTIVITY:  You should plan to take it easy for the rest of today and you should NOT DRIVE or use heavy machinery  until tomorrow (because of the sedation medicines used during the test).    FOLLOW UP: Our staff will call the number listed on your records the next business day following your procedure to check on you and address any questions or concerns that you may have regarding the information given to you following your procedure. If we do not reach you, we will leave a message.  However, if you are feeling well and you are not experiencing any problems, there is no need to return our call.  We will assume that you have returned to your regular daily activities without incident.  If any biopsies were taken you will be contacted by phone or by letter within the next 1-3 weeks.  Please call us at [phone] if you have not heard about the biopsies in 3 weeks.    SIGNATURES/CONFIDENTIALITY: You and/or your care partner have signed paperwork which will be entered into your electronic medical record.  These signatures attest to the fact that that the information above on your After Visit Summary has been reviewed and is understood.  Full responsibility of the confidentiality of this discharge information lies with you and/or your care-partner. 

## 2018-09-29 NOTE — Op Note (Signed)
[facility] Patient Name: Stacy Baker Procedure Date: 09/29/2018 3:43 PM MRN: [id number] Endoscopist: Rachael Fee , MD Age: 63 Referring MD:  Date of Birth: Jun 27, 1956 Gender: Female Account #: [id number] Procedure:                Colonoscopy Indications:              Screening for colorectal malignant neoplasm Medicines:                Monitored Anesthesia Care Procedure:                Pre-Anesthesia Assessment:                           - Prior to the procedure, a History and Physical                            was performed, and patient medications and                            allergies were reviewed. The patient's tolerance of                            previous anesthesia was also reviewed. The risks                            and benefits of the procedure and the sedation                            options and risks were discussed with the patient.                            All questions were answered, and informed consent                            was obtained. Prior Anticoagulants: The patient has                            taken no previous anticoagulant or antiplatelet                            agents. ASA Grade Assessment: II - A patient with                            mild systemic disease. After reviewing the risks                            and benefits, the patient was deemed in                            satisfactory condition to undergo the procedure.                           After obtaining informed consent, the colonoscope  was passed under direct vision. Throughout the                            procedure, the patient's blood pressure, pulse, and                            oxygen saturations were monitored continuously. The                            Model CF-HQ190L [id number]) scope was introduced                            through the anus and advanced to the the cecum,                            identified  by appendiceal orifice and ileocecal                            valve. The colonoscopy was performed without                            difficulty. The patient tolerated the procedure                            well. The quality of the bowel preparation was good. Scope In: 3:51:57 PM Scope Out: 4:13:04 PM Scope Withdrawal Time: 0 hours 11 minutes 26 seconds  Total Procedure Duration: 0 hours 21 minutes 7 seconds  Findings:                 The entire examined colon appeared normal on direct                            and retroflexion views. Complications:            No immediate complications. Estimated blood loss:                            None. Estimated Blood Loss:     Estimated blood loss: none. Impression:               - The entire examined colon is normal on direct and                            retroflexion views.                           - No polyps or cancers. Recommendation:           - Patient has a contact number available for                            emergencies. The signs and symptoms of potential                            delayed complications were discussed with the  patient. Return to normal activities tomorrow.                            Written discharge instructions were provided to the                            patient.                           - Resume previous diet.                           - Continue present medications.                           - Repeat colonoscopy in 10 years for screening.                           - Dr. Christella HartiganJacobs' office will contact you about new                            office visit to discuss your upper GI issues. Rachael Feeaniel P Jacobs, MD 09/29/2018 4:14:52 PM This report has been signed electronically.

## 2018-09-29 NOTE — Progress Notes (Signed)
PT taken to PACU. Monitors in place. VSS. Report given to RN. 

## 2018-09-30 ENCOUNTER — Telehealth: Payer: Self-pay

## 2018-09-30 NOTE — Telephone Encounter (Signed)
  Follow up Call-  Call back number 09/29/2018  Post procedure Call Back phone  # [phone]  Permission to leave phone message Yes  Some recent data might be hidden     Patient questions:  Do you have a fever, pain , or abdominal swelling? No. Pain Score  0 *  Have you tolerated food without any problems? Yes.    Have you been able to return to your normal activities? Yes.    Do you have any questions about your discharge instructions: Diet   No. Medications  No. Follow up visit  No.  Do you have questions or concerns about your Care? No.  Actions: * If pain score is 4 or above: No action needed, pain <4.

## 2018-10-11 DIAGNOSIS — Z01419 Encounter for gynecological examination (general) (routine) without abnormal findings: Secondary | ICD-10-CM | POA: Diagnosis not present

## 2018-10-11 DIAGNOSIS — Z681 Body mass index (BMI) 19 or less, adult: Secondary | ICD-10-CM | POA: Diagnosis not present

## 2018-11-01 ENCOUNTER — Ambulatory Visit: Payer: BLUE CROSS/BLUE SHIELD | Admitting: Gastroenterology

## 2018-11-12 DIAGNOSIS — H00014 Hordeolum externum left upper eyelid: Secondary | ICD-10-CM | POA: Diagnosis not present

## 2019-01-11 ENCOUNTER — Ambulatory Visit (INDEPENDENT_AMBULATORY_CARE_PROVIDER_SITE_OTHER): Payer: BLUE CROSS/BLUE SHIELD | Admitting: Family Medicine

## 2019-01-11 DIAGNOSIS — Z23 Encounter for immunization: Secondary | ICD-10-CM

## 2019-01-11 NOTE — Patient Instructions (Signed)
Health Maintenance Due  Topic Date Due  . PAP SMEAR-Modifier  08/15/2017    Depression screen PHQ 2/9 09/17/2018  Decreased Interest 0  Down, Depressed, Hopeless 0  PHQ - 2 Score 0

## 2019-02-23 DIAGNOSIS — L918 Other hypertrophic disorders of the skin: Secondary | ICD-10-CM | POA: Diagnosis not present

## 2019-02-23 DIAGNOSIS — L821 Other seborrheic keratosis: Secondary | ICD-10-CM | POA: Diagnosis not present

## 2019-02-23 DIAGNOSIS — D224 Melanocytic nevi of scalp and neck: Secondary | ICD-10-CM | POA: Diagnosis not present

## 2019-02-23 DIAGNOSIS — L738 Other specified follicular disorders: Secondary | ICD-10-CM | POA: Diagnosis not present

## 2019-05-03 ENCOUNTER — Encounter: Payer: Self-pay | Admitting: Family Medicine

## 2019-05-03 DIAGNOSIS — Z1239 Encounter for other screening for malignant neoplasm of breast: Secondary | ICD-10-CM

## 2019-05-03 DIAGNOSIS — R7989 Other specified abnormal findings of blood chemistry: Secondary | ICD-10-CM

## 2019-05-03 DIAGNOSIS — M85852 Other specified disorders of bone density and structure, left thigh: Secondary | ICD-10-CM

## 2019-05-03 DIAGNOSIS — M8580 Other specified disorders of bone density and structure, unspecified site: Secondary | ICD-10-CM

## 2019-06-20 ENCOUNTER — Other Ambulatory Visit: Payer: Self-pay

## 2019-06-20 ENCOUNTER — Ambulatory Visit
Admission: RE | Admit: 2019-06-20 | Discharge: 2019-06-20 | Disposition: A | Discharge status: Home / Self Care | Payer: BC Managed Care – PPO | Source: Ambulatory Visit | Attending: Family Medicine | Admitting: Family Medicine

## 2019-06-20 DIAGNOSIS — Z1239 Encounter for other screening for malignant neoplasm of breast: Secondary | ICD-10-CM

## 2019-06-20 DIAGNOSIS — Z1231 Encounter for screening mammogram for malignant neoplasm of breast: Secondary | ICD-10-CM | POA: Diagnosis not present

## 2019-06-20 LAB — HM MAMMOGRAPHY

## 2019-06-21 DIAGNOSIS — H2513 Age-related nuclear cataract, bilateral: Secondary | ICD-10-CM | POA: Diagnosis not present

## 2019-06-21 DIAGNOSIS — H04123 Dry eye syndrome of bilateral lacrimal glands: Secondary | ICD-10-CM | POA: Diagnosis not present

## 2019-06-21 DIAGNOSIS — H52203 Unspecified astigmatism, bilateral: Secondary | ICD-10-CM | POA: Diagnosis not present

## 2019-07-07 ENCOUNTER — Telehealth: Payer: Self-pay | Admitting: Gastroenterology

## 2019-07-07 NOTE — Telephone Encounter (Signed)
Dr Ardis Hughs last note says she needs an office visit to discuss.  Can you please set that up?  Thank you

## 2019-07-20 ENCOUNTER — Ambulatory Visit
Admission: RE | Admit: 2019-07-20 | Discharge: 2019-07-20 | Disposition: A | Discharge status: Home / Self Care | Payer: BC Managed Care – PPO | Source: Ambulatory Visit | Attending: Family Medicine | Admitting: Family Medicine

## 2019-07-20 ENCOUNTER — Encounter: Payer: Self-pay | Admitting: Family Medicine

## 2019-07-20 ENCOUNTER — Other Ambulatory Visit: Payer: Self-pay

## 2019-07-20 DIAGNOSIS — M85852 Other specified disorders of bone density and structure, left thigh: Secondary | ICD-10-CM

## 2019-07-20 DIAGNOSIS — M8580 Other specified disorders of bone density and structure, unspecified site: Secondary | ICD-10-CM

## 2019-07-20 DIAGNOSIS — R7989 Other specified abnormal findings of blood chemistry: Secondary | ICD-10-CM

## 2019-07-20 DIAGNOSIS — M85851 Other specified disorders of bone density and structure, right thigh: Secondary | ICD-10-CM | POA: Diagnosis not present

## 2019-07-20 DIAGNOSIS — Z780 Asymptomatic menopausal state: Secondary | ICD-10-CM | POA: Diagnosis not present

## 2019-07-20 LAB — HM DEXA SCAN

## 2019-07-22 DIAGNOSIS — H02413 Mechanical ptosis of bilateral eyelids: Secondary | ICD-10-CM | POA: Diagnosis not present

## 2019-07-22 DIAGNOSIS — H53483 Generalized contraction of visual field, bilateral: Secondary | ICD-10-CM | POA: Diagnosis not present

## 2019-07-22 DIAGNOSIS — H02834 Dermatochalasis of left upper eyelid: Secondary | ICD-10-CM | POA: Diagnosis not present

## 2019-07-22 DIAGNOSIS — H04121 Dry eye syndrome of right lacrimal gland: Secondary | ICD-10-CM | POA: Diagnosis not present

## 2019-07-22 DIAGNOSIS — H02831 Dermatochalasis of right upper eyelid: Secondary | ICD-10-CM | POA: Diagnosis not present

## 2019-07-22 DIAGNOSIS — H04122 Dry eye syndrome of left lacrimal gland: Secondary | ICD-10-CM | POA: Diagnosis not present

## 2019-08-17 ENCOUNTER — Encounter: Payer: Self-pay | Admitting: Gastroenterology

## 2019-08-17 ENCOUNTER — Ambulatory Visit: Payer: BC Managed Care – PPO | Admitting: Gastroenterology

## 2019-08-17 VITALS — BP 104/68 | HR 60 | Temp 98.1°F | Ht 67.75 in | Wt 125.2 lb

## 2019-08-17 DIAGNOSIS — R1310 Dysphagia, unspecified: Secondary | ICD-10-CM

## 2019-08-17 DIAGNOSIS — Z1159 Encounter for screening for other viral diseases: Secondary | ICD-10-CM

## 2019-08-17 NOTE — Patient Instructions (Signed)
If you are age 61 or younger, your body mass index should be between 19-25. Your Body mass index is 19.19 kg/m. If this is out of the aformentioned range listed, please consider follow up with your Primary Care Provider.   You have been scheduled for an endoscopy. Please follow written instructions given to you at your visit today. If you use inhalers (even only as needed), please bring them with you on the day of your procedure.  Thank you for choosing me and [facility].  Dr.Jacobs

## 2019-08-17 NOTE — Progress Notes (Signed)
Review of pertinent gastrointestinal problems: 1.  Routine risk for colon cancer.  Colonoscopy January 2020 was completely normal.  I recommended repeat examination at 10-year interval.   HPI: This is a very pleasant 63 year old woman who is here to discuss intermittent dysphagia  Chief complaint is chronic intermittent dysphagia  For 3 to 4 years she has had chronic intermittent slightly progressive solid food only dysphagia.  She says chicken, salads, apples can cause troubles.  She tries to slow down her eating and chew better this can help.  Happens about twice per month.  She actually started off by saying she was having choking episodes but I do not think that is really what she is having.  She tells me she is able to move air throughout, she is able to speak when these happen.  She does not have heartburn.  Her weight has been overall stable.  Since Covid she has been under more stress and is drinking twice as much caffeine as usual and a bit more wine than usual as well.  Her bowels are intermittently loose but never bloody  Review of systems: Pertinent positive and negative review of systems were noted in the above HPI section. All other review negative.   Past Medical History:  Diagnosis Date  . Allergy    Tamiflu   . History of D&C    miscarriage 2001  . History of laparoscopy-assisted vaginal hysterectomy   . Hx of LASIK    bilateral  . Varicose veins of both lower extremities    veins injected bilateral  . Wisdom teeth extracted     Past Surgical History:  Procedure Laterality Date  . COLONOSCOPY  07/2008   polyps  . DILATION AND CURETTAGE OF UTERUS  1999   miscarriage  . REFRACTIVE SURGERY Bilateral 2007   Lasik  . URETHRAL STRICTURE DILATATION  1978   in high school     Current Outpatient Medications  Medication Sig Dispense Refill  . aspirin 81 MG tablet Take 81 mg by mouth daily.    . cycloSPORINE (RESTASIS) 0.05 % ophthalmic emulsion Place 1 drop  into both eyes 2 (two) times daily.    Marland Kitchen. DIVIGEL 1 MG/GM GEL   0  . Multiple Vitamin (MULTIVITAMIN) tablet Take 1 tablet by mouth daily.    . Omega-3 Fatty Acids (FISH OIL) 1200 MG CAPS Take by mouth daily.    . Probiotic Product (PHILLIPS COLON HEALTH PO) Take by mouth daily.    . Psyllium (METAMUCIL FIBER PO) Take 1 each by mouth daily as needed. 1 tbs daily     . VITAMIN D PO Take 1 tablet by mouth daily.     No current facility-administered medications for this visit.     Allergies as of 08/17/2019 - Review Complete 08/17/2019  Allergen Reaction Noted  . Tamiflu [oseltamivir phosphate] Hives and Itching 06/04/2010    Family History  Problem Relation Age of Onset  . Prostate cancer Father        robotic surgery and free 10 years in 2020  . Stomach cancer Maternal Uncle   . Fibromyalgia Mother   . Arthritis Mother   . Hyperlipidemia Mother   . Hypertension Mother        may be father though  . Hemochromatosis Mother   . Healthy Sister   . Non-Hodgkin's lymphoma Brother        doing well with treatment  . Clotting disorder Maternal Grandfather   . Deep vein thrombosis Brother  didnt dissolve- possible IVC filter  . Glaucoma Paternal Grandmother   . Colon cancer Neg Hx   . Rectal cancer Neg Hx     Social History   Socioeconomic History  . Marital status: Married    Spouse name: Not on file  . Number of children: Not on file  . Years of education: Not on file  . Highest education level: Not on file  Occupational History  . Not on file  Social Needs  . Financial resource strain: Not on file  . Food insecurity    Worry: Not on file    Inability: Not on file  . Transportation needs    Medical: Not on file    Non-medical: Not on file  Tobacco Use  . Smoking status: Never Smoker  . Smokeless tobacco: Never Used  Substance and Sexual Activity  . Alcohol use: Yes    Alcohol/week: 7.0 standard drinks    Types: 7 Glasses of wine per week  . Drug use: No  .  Sexual activity: Yes    Birth control/protection: Post-menopausal  Lifestyle  . Physical activity    Days per week: Not on file    Minutes per session: Not on file  . Stress: Not on file  Relationships  . Social Herbalist on phone: Not on file    Gets together: Not on file    Attends religious service: Not on file    Active member of club or organization: Not on file    Attends meetings of clubs or organizations: Not on file    Relationship status: Not on file  . Intimate partner violence    Fear of current or ex partner: Not on file    Emotionally abused: Not on file    Physically abused: Not on file    Forced sexual activity: Not on file  Other Topics Concern  . Not on file  Social History Narrative   Married with 61 year old adopted daughter Bonnita Nasuti in January 2020      Dianna Rossetti - now  Semi retired - working 1 day a week. Worked in [place].       Hobbies: exercise, may volunteer at the hospital, travel, volunteer work at [employer]. [place]      Physical Exam: BP 104/68 (BP Location: Left Arm, Patient Position: Sitting, Cuff Size: Normal)   Pulse 60   Temp 98.1 F (36.7 C)   Ht 5' 7.75" (1.721 m) Comment: height measured without shoes  Wt 125 lb 4 oz (56.8 kg)   LMP  (LMP Unknown)   BMI 19.19 kg/m  Constitutional: generally well-appearing Psychiatric: alert and oriented x3 Eyes: extraocular movements intact Mouth: oral pharynx moist, no lesions Neck: supple no lymphadenopathy Cardiovascular: heart regular rate and rhythm Lungs: clear to auscultation bilaterally Abdomen: soft, nontender, nondistended, no obvious ascites, no peritoneal signs, normal bowel sounds Extremities: no lower extremity edema bilaterally Skin: no lesions on visible extremities   Assessment and plan: 63 y.o. female with intermittent solid food slightly progressive dysphagia  She does not have heartburn but I did discuss with her the fact that acid related damage is often a cause of  dysphagia like hers and I recommended a trial of proton pump inhibitors once daily until the time of an upper endoscopy.  She declined because she is not interested in pills.  She does admit though that if obvious acid damage is seen at the time of upper endoscopy she would be happy to  take any antiacid medicine at that point.  She will therefore have an upper endoscopy.  I see no reason for any further blood tests or imaging studies prior to then.   Please see the "Patient Instructions" section for addition details about the plan.   Rob Bunting, MD [facility] 08/17/2019, 2:17 PM  Cc: Shelva Majestic, MD

## 2019-08-19 ENCOUNTER — Encounter: Payer: Self-pay | Admitting: Gastroenterology

## 2019-08-19 ENCOUNTER — Ambulatory Visit: Payer: BC Managed Care – PPO

## 2019-08-19 DIAGNOSIS — Z1159 Encounter for screening for other viral diseases: Secondary | ICD-10-CM | POA: Diagnosis not present

## 2019-08-20 LAB — SARS CORONAVIRUS 2 (TAT 6-24 HRS): SARS Coronavirus 2: NEGATIVE

## 2019-08-23 ENCOUNTER — Ambulatory Visit (AMBULATORY_SURGERY_CENTER): Payer: BC Managed Care – PPO | Admitting: Gastroenterology

## 2019-08-23 ENCOUNTER — Encounter: Payer: Self-pay | Admitting: Gastroenterology

## 2019-08-23 ENCOUNTER — Other Ambulatory Visit: Payer: Self-pay

## 2019-08-23 VITALS — BP 99/58 | HR 55 | Temp 97.8°F | Resp 19 | Ht 67.75 in | Wt 125.0 lb

## 2019-08-23 DIAGNOSIS — K222 Esophageal obstruction: Secondary | ICD-10-CM

## 2019-08-23 DIAGNOSIS — R1310 Dysphagia, unspecified: Secondary | ICD-10-CM | POA: Diagnosis not present

## 2019-08-23 HISTORY — PX: UPPER GASTROINTESTINAL ENDOSCOPY: SHX188

## 2019-08-23 MED ORDER — SODIUM CHLORIDE 0.9 % IV SOLN: 500.0000 mL | Freq: Once | INTRAVENOUS | Status: DC

## 2019-08-23 NOTE — Progress Notes (Signed)
Vitals-DT Temp-CW 

## 2019-08-23 NOTE — Op Note (Signed)
[facility] Patient Name: Stacy Baker Procedure Date: 08/23/2019 9:19 AM MRN: [id number] Endoscopist: Milus Banister , MD Age: 63 Referring MD:  Date of Birth: 06-Oct-1955 Gender: Female Account #: [id number] Procedure:                Upper GI endoscopy Indications:              Dysphagia without heartburn Medicines:                Monitored Anesthesia Care Procedure:                Pre-Anesthesia Assessment:                           - Prior to the procedure, a History and Physical                            was performed, and patient medications and                            allergies were reviewed. The patient's tolerance of                            previous anesthesia was also reviewed. The risks                            and benefits of the procedure and the sedation                            options and risks were discussed with the patient.                            All questions were answered, and informed consent                            was obtained. Prior Anticoagulants: The patient has                            taken no previous anticoagulant or antiplatelet                            agents. ASA Grade Assessment: II - A patient with                            mild systemic disease. After reviewing the risks                            and benefits, the patient was deemed in                            satisfactory condition to undergo the procedure.                           After obtaining informed consent, the endoscope was  passed under direct vision. Throughout the                            procedure, the patient's blood pressure, pulse, and                            oxygen saturations were monitored continuously. The                            Endoscope was introduced through the mouth, and                            advanced to the second part of duodenum. The upper                            GI endoscopy was  accomplished without difficulty.                            The patient tolerated the procedure well. Scope In: Scope Out: Findings:                 One benign-appearing, intrinsic mild stenosis was                            found at the gastroesophageal junction (thick                            Schatzki's ring vs focal peptic structure). A TTS                            dilator was passed through the scope. Dilation with                            an 18-19-20 mm balloon dilator was performed to 18                            mm. There was typical superficial mucosal                            disruption and minor self limited bleeding                            following dilation.                           The exam was otherwise without abnormality. Complications:            No immediate complications. Estimated blood loss:                            None. Estimated Blood Loss:     Estimated blood loss: none. Impression:               - Benign-appearing GE junction stricture, dilated                            (  thick Schatzki's ring vs. focal peptic stricture)                           - The examination was otherwise normal. Recommendation:           - Patient has a contact number available for                            emergencies. The signs and symptoms of potential                            delayed complications were discussed with the                            patient. Return to normal activities tomorrow.                            Written discharge instructions were provided to the                            patient.                           - Resume previous diet.                           - Continue present medications.                           - If swallowing trouble persists following this                            dilation, would consider 4-6 trial of PPI                            (antiacid) once daily. Rachael Fee, MD 08/23/2019 9:40:10 AM This report has been  signed electronically.

## 2019-08-23 NOTE — Patient Instructions (Signed)
Please see handout given to you on Stricture and Post dilation Diet. Thank you for letting us take care of your healthcare needs today.   YOU HAD AN ENDOSCOPIC PROCEDURE TODAY AT [facility]:   Refer to the procedure report that was given to you for any specific questions about what was found during the examination.  If the procedure report does not answer your questions, please call your gastroenterologist to clarify.  If you requested that your care partner not be given the details of your procedure findings, then the procedure report has been included in a sealed envelope for you to review at your convenience later.  YOU SHOULD EXPECT: Some feelings of bloating in the abdomen. Passage of more gas than usual.  Walking can help get rid of the air that was put into your GI tract during the procedure and reduce the bloating. If you had a lower endoscopy (such as a colonoscopy or flexible sigmoidoscopy) you may notice spotting of blood in your stool or on the toilet paper. If you underwent a bowel prep for your procedure, you may not have a normal bowel movement for a few days.  Please Note:  You might notice some irritation and congestion in your nose or some drainage.  This is from the oxygen used during your procedure.  There is no need for concern and it should clear up in a day or so.  SYMPTOMS TO REPORT IMMEDIATELY:    Following upper endoscopy (EGD)  Vomiting of blood or coffee ground material  New chest pain or pain under the shoulder blades  Painful or persistently difficult swallowing  New shortness of breath  Fever of 100F or higher  Black, tarry-looking stools  For urgent or emergent issues, a gastroenterologist can be reached at any hour by calling [phone].   DIET:  Please see Post dilation diet sheet today, tomorrow you may proceed to your regular diet.  Drink plenty of fluids but you should avoid alcoholic beverages for 24 hours.  ACTIVITY:  You should  plan to take it easy for the rest of today and you should NOT DRIVE or use heavy machinery until tomorrow (because of the sedation medicines used during the test).    FOLLOW UP: Our staff will call the number listed on your records 48-72 hours following your procedure to check on you and address any questions or concerns that you may have regarding the information given to you following your procedure. If we do not reach you, we will leave a message.  We will attempt to reach you two times.  During this call, we will ask if you have developed any symptoms of COVID 19. If you develop any symptoms (ie: fever, flu-like symptoms, shortness of breath, cough etc.) before then, please call [phone].  If you test positive for Covid 19 in the 2 weeks post procedure, please call and report this information to [place].    If any biopsies were taken you will be contacted by phone or by letter within the next 1-3 weeks.  Please call us at [phone] if you have not heard about the biopsies in 3 weeks.    SIGNATURES/CONFIDENTIALITY: You and/or your care partner have signed paperwork which will be entered into your electronic medical record.  These signatures attest to the fact that that the information above on your After Visit Summary has been reviewed and is understood.  Full responsibility of the confidentiality of this discharge information lies with you and/or  your care-partner. 

## 2019-08-23 NOTE — Progress Notes (Signed)
Report to PACU, RN, vss, BBS= Clear.  

## 2019-08-23 NOTE — Progress Notes (Signed)
Called to room to assist during endoscopic procedure.  Patient ID and intended procedure confirmed with present staff. Received instructions for my participation in the procedure from the performing physician.  

## 2019-08-25 ENCOUNTER — Telehealth: Payer: Self-pay | Admitting: *Deleted

## 2019-08-25 NOTE — Telephone Encounter (Signed)
  Follow up Call-  Call back number 08/23/2019 09/29/2018  Post procedure Call Back phone  # [phone]  Permission to leave phone message Yes Yes  Some recent data might be hidden     Patient questions:  Do you have a fever, pain , or abdominal swelling? No. Pain Score  0 *  Have you tolerated food without any problems? Yes.    Have you been able to return to your normal activities? Yes.    Do you have any questions about your discharge instructions: Diet   No. Medications  No. Follow up visit  No.  Do you have questions or concerns about your Care? No.  Actions: * If pain score is 4 or above: 1. No action needed, pain <4.Have you developed a fever since your procedure? no  2.   Have you had an respiratory symptoms (SOB or cough) since your procedure? no  3.   Have you tested positive for COVID 19 since your procedure no  4.   Have you had any family members/close contacts diagnosed with the COVID 19 since your procedure?  no   If yes to any of these questions please route to Joylene John, RN and Alphonsa Gin, Therapist, sports.

## 2019-09-20 DIAGNOSIS — H02834 Dermatochalasis of left upper eyelid: Secondary | ICD-10-CM | POA: Diagnosis not present

## 2019-09-20 DIAGNOSIS — H02831 Dermatochalasis of right upper eyelid: Secondary | ICD-10-CM | POA: Diagnosis not present

## 2019-09-20 DIAGNOSIS — H02413 Mechanical ptosis of bilateral eyelids: Secondary | ICD-10-CM | POA: Diagnosis not present

## 2019-09-21 ENCOUNTER — Encounter: Payer: BLUE CROSS/BLUE SHIELD | Admitting: Family Medicine

## 2019-10-06 ENCOUNTER — Ambulatory Visit (INDEPENDENT_AMBULATORY_CARE_PROVIDER_SITE_OTHER): Payer: BC Managed Care – PPO | Admitting: Family Medicine

## 2019-10-06 ENCOUNTER — Encounter: Payer: Self-pay | Admitting: Family Medicine

## 2019-10-06 ENCOUNTER — Other Ambulatory Visit: Payer: Self-pay

## 2019-10-06 VITALS — BP 92/60 | HR 57 | Temp 97.7°F | Ht 67.75 in | Wt 126.2 lb

## 2019-10-06 DIAGNOSIS — Z13220 Encounter for screening for lipoid disorders: Secondary | ICD-10-CM | POA: Diagnosis not present

## 2019-10-06 DIAGNOSIS — Z0000 Encounter for general adult medical examination without abnormal findings: Secondary | ICD-10-CM

## 2019-10-06 DIAGNOSIS — M85851 Other specified disorders of bone density and structure, right thigh: Secondary | ICD-10-CM | POA: Diagnosis not present

## 2019-10-06 DIAGNOSIS — D6859 Other primary thrombophilia: Secondary | ICD-10-CM

## 2019-10-06 LAB — COMPREHENSIVE METABOLIC PANEL
ALT: 15 U/L (ref 0–35)
AST: 20 U/L (ref 0–37)
Albumin: 4.3 g/dL (ref 3.5–5.2)
Alkaline Phosphatase: 44 U/L (ref 39–117)
BUN: 14 mg/dL (ref 6–23)
CO2: 31 mEq/L (ref 19–32)
Calcium: 9.4 mg/dL (ref 8.4–10.5)
Chloride: 103 mEq/L (ref 96–112)
Creatinine, Ser: 0.78 mg/dL (ref 0.40–1.20)
GFR: 75.04 mL/min (ref >60.00)
Glucose, Bld: 112 mg/dL — ABNORMAL HIGH (ref 70–99)
Potassium: 3.7 mEq/L (ref 3.5–5.1)
Sodium: 140 mEq/L (ref 135–145)
Total Bilirubin: 0.5 mg/dL (ref 0.2–1.2)
Total Protein: 6.3 g/dL (ref 6.0–8.3)

## 2019-10-06 LAB — CBC WITH DIFFERENTIAL/PLATELET
Basophils Absolute: 0 10*3/uL (ref 0.0–0.1)
Basophils Relative: 0.6 % (ref 0.0–3.0)
Eosinophils Absolute: 0.2 10*3/uL (ref 0.0–0.7)
Eosinophils Relative: 3 % (ref 0.0–5.0)
HCT: 41.6 % (ref 36.0–46.0)
Hemoglobin: 13.9 g/dL (ref 12.0–15.0)
Lymphocytes Relative: 23.6 % (ref 12.0–46.0)
Lymphs Abs: 1.5 10*3/uL (ref 0.7–4.0)
MCHC: 33.5 g/dL (ref 30.0–36.0)
MCV: 92.8 fl (ref 78.0–100.0)
Monocytes Absolute: 0.3 10*3/uL (ref 0.1–1.0)
Monocytes Relative: 4.9 % (ref 3.0–12.0)
Neutro Abs: 4.3 10*3/uL (ref 1.4–7.7)
Neutrophils Relative %: 67.9 % (ref 43.0–77.0)
Platelets: 154 10*3/uL (ref 150.0–400.0)
RBC: 4.48 Mil/uL (ref 3.87–5.11)
RDW: 13.2 % (ref 11.5–15.5)
WBC: 6.3 10*3/uL (ref 4.0–10.5)

## 2019-10-06 LAB — LIPID PANEL
Cholesterol: 182 mg/dL (ref 0–200)
HDL: 76.4 mg/dL (ref >39.00)
LDL Cholesterol: 86 mg/dL (ref 0–99)
NonHDL: 105.22
Total CHOL/HDL Ratio: 2
Triglycerides: 97 mg/dL (ref 0.0–149.0)
VLDL: 19.4 mg/dL (ref 0.0–40.0)

## 2019-10-06 LAB — VITAMIN D 25 HYDROXY (VIT D DEFICIENCY, FRACTURES): VITD: 42.66 ng/mL (ref 30.00–100.00)

## 2019-10-06 NOTE — Progress Notes (Signed)
Phone: [phone]   Subjective:  Patient presents today for their annual physical. Chief complaint-noted.   See problem oriented charting- ROS- full  review of systems was completed and negative except for: ear itching  The following were reviewed and entered/updated in epic: Past Medical History:  Diagnosis Date  . Allergy    Tamiflu , seasonal  . Clotting disorder ([place])    protein S deficiency   . History of D&C    miscarriage 2001  . History of laparoscopy-assisted vaginal hysterectomy   . Hx of LASIK    bilateral  . Osteopenia   . Varicose veins of both lower extremities    veins injected bilateral  . Wisdom teeth extracted    Patient Active Problem List   Diagnosis Date Noted  . Protein S deficiency ([place]) 09/17/2018    Priority: Medium  . PARESTHESIA 12/28/2007    Priority: Low  . Osteopenia 07/20/2019   Past Surgical History:  Procedure Laterality Date  . COLONOSCOPY  07/2008   polyps, 09/2017  . DILATION AND CURETTAGE OF UTERUS  1999   miscarriage  . POLYPECTOMY    . REFRACTIVE SURGERY Bilateral 2007   Lasik  . UPPER GASTROINTESTINAL ENDOSCOPY  08/23/2019   dysphagia  . URETHRAL STRICTURE DILATATION  1978   in high school     Family History  Problem Relation Age of Onset  . Prostate cancer Father        robotic surgery and free 10 years in 2020  . Colon polyps Father   . Stomach cancer Maternal Uncle   . Fibromyalgia Mother   . Arthritis Mother   . Hyperlipidemia Mother   . Hypertension Mother        may be father though  . Hemochromatosis Mother   . Healthy Sister   . Non-Hodgkin's lymphoma Brother        doing well with treatment  . Clotting disorder Maternal Grandfather   . Deep vein thrombosis Brother        didnt dissolve- possible IVC filter  . Glaucoma Paternal Grandmother   . Colon cancer Neg Hx   . Rectal cancer Neg Hx   . Esophageal cancer Neg Hx     Medications- reviewed and updated Current Outpatient Medications    Medication Sig Dispense Refill  . aspirin 81 MG tablet Take 81 mg by mouth daily.    . Calcium Carb-Cholecalciferol (CALCIUM 1000 + D) 1000-800 MG-UNIT TABS calcium  1200 mg    . cycloSPORINE (RESTASIS) 0.05 % ophthalmic emulsion Place 1 drop into both eyes 2 (two) times daily.    Marland Kitchen DIVIGEL 1 MG/GM GEL   0  . Multiple Vitamin (MULTIVITAMIN) tablet Take 1 tablet by mouth daily.    . Omega-3 Fatty Acids (FISH OIL) 1200 MG CAPS Take by mouth daily.    . Probiotic Product ([place]) Take by mouth daily.    . Psyllium (METAMUCIL FIBER PO) Take 1 each by mouth daily as needed. 1 tbs daily     . VITAMIN D PO Take 1 tablet by mouth daily.     No current facility-administered medications for this visit.    Allergies-reviewed and updated Allergies  Allergen Reactions  . 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       Hobbies: exercise, may volunteer at the hospital, travel, volunteer work at [employer]. [place]    Objective  Objective:  BP 92/60   Pulse (!) 57   Temp 97.7 F (36.5 C)   Ht 5' 7.75" (1.721 m)   Wt 126 lb 3.2 oz (57.2 kg)   LMP  (LMP Unknown)   SpO2 99%   BMI 19.33 kg/m  Gen: NAD, resting comfortably HEENT: Mask not removed due to covid 19. TM normal. Bridge of nose normal. Eyelids normal.  Neck: no thyromegaly or cervical lymphadenopathy  CV: RRR no murmurs rubs or gallops Lungs: CTAB no crackles, wheeze, rhonchi Abdomen: soft/nontender/nondistended/normal bowel sounds. No rebound or guarding.  Ext: no edema Skin: warm, dry Neuro: grossly normal, moves all extremities, PERRLA   Assessment and Plan   64 y.o. female presenting for annual physical.  Health Maintenance counseling: 1. Anticipatory guidance: Patient counseled regarding regular dental exams yes  q6 months, eye exams yes,  avoiding smoking and second hand smoke yes , limiting alcohol to 1 beverage per day (weekends) .   2. Risk factor reduction:  Advised patient of need for regular exercise and diet rich and fruits and vegetables to reduce risk of heart attack and stroke. Exercise- good 4-5 days a week cardio- doing rowing- we discussed adding some weight bearing. Diet- cooks at home.  Reasonably healthy diet-would discourage weight loss Wt Readings from Last 3 Encounters:  10/06/19 126 lb 3.2 oz (57.2 kg)  08/23/19 125 lb (56.7 kg)  08/17/19 125 lb 4 oz (56.8 kg)  3. Immunizations/screenings/ancillary studies-up-to-date other than COVID-19 vaccination when available  Immunization History  Administered Date(s) Administered  . Influenza,inj,Quad PF,6+ Mos 06/11/2018, 06/03/2019  . Tdap 06/24/2017  . Zoster Recombinat (Shingrix) 09/21/2018, 01/11/2019  4. Cervical cancer screening-scheduled for March with gynecology.  Last on file from 2015 from Dr. Adalberto Ill  5. Breast cancer screening-  breast exam planned with gynecology and mammogram-June 20, 2019 6. Colon cancer screening - yes September 29, 2018 with 10-year repeat planned  7. Skin cancer screening- Dr. Amy Sw[place] derm- patient would like to switch. advised regular sunscreen use. Denies worrisome, changing, or new skin lesions.  8. Birth control/STD check-postmenopausal and monogamous  9. Osteoporosis screening at 67-known osteopenia see below -never smoker  Status of chronic or acute concerns   Stress- dad not doing well with multiple complications age 5 post covid- hospital 4 weeks. Mom doing ok after hospital for a week and then rehab for 2 weeks.   08/23/2019-Schatzki's ring dilation- less choking since that time.    Protein S deficiency ([facility])-no history of DVT or PE.  Patient remains on aspirin.  I did tell patient that being on estradiol may increase risk of clot. No leg swelling or SOB  Ear itching - exam is normal  of ear. Can try mineral oil but no obvious wax (in case mild dryness) could also try cortisone on external portions of ear for up to 7 days  Osteopenia of neck of right femur-patient on calcium and vitamin D and working on weightbearing exercise- discussed would get exercise other than rowing for weight bearing . Mom history of broken hip so this concerns her. We calculated frax score- there was an error with dexa as they thought she was on estradiol- but it is just topical. Risk 11% total fracture and 1.1% hip.   Lipids largely normal 1 year ago.- she would like to repeat. 10 year ascvd risk  only 1.5% based on prior labs.   Lab Results  Component Value Date   CHOL 189 09/21/2018   HDL 79.00 09/21/2018   LDLCALC 97 09/21/2018   TRIG 63.0 09/21/2018   CHOLHDL 2 09/21/2018   Recommended follow up: 1 year physical - we did not specifically discuss this  Lab/Order associations: fasting   ICD-10-CM   1. Preventative health care  Z00.00   2. Protein S deficiency (HCC)  D68.59 CBC with Differential/Platelet    Comprehensive metabolic panel  3. Osteopenia of neck of right femur  M85.851 VITAMIN D 25 Hydroxy (Vit-D Deficiency, Fractures)  4. Screening for hyperlipidemia  Z13.220 Lipid panel   Return precautions advised.  Tana Conch, MD

## 2019-10-06 NOTE — Patient Instructions (Addendum)
Health Maintenance Due  Topic Date Due  . PAP SMEAR-Modifier-scheduled for march . Team please request last pap from Dr. Adalberto Ill as she reports has had one since 2015 08/15/2017   Target 1200mg  calcium per day (looks like around 500mg  in your pills so target 700 more in diet) and 800 units vitamin D- you have that between your two pills  Ear itching - exam is normal of ear. Can try mineral oil but no obvious wax (in case mild dryness) could also try cortisone on external portions of ear for up to 7 days  Mineral oil for ear full of wax Purchase mineral oil from laxative aisle Lay down on your side with ear that is bothering you facing up Use 3-4 drops with a dropper and place in ear for 30 seconds Place cotton swab outside of ear Turn to other side and allow this to drain Repeat 3-4 x a day Return to see if not improving within a few days  Please stop by lab before you go If you do not have mychart- we will call you about results within 5 business days of receiving them.  If you have mychart- we will send your results within 3 business days of [place] receiving them.  If abnormal or we want to clarify a result, we will call or mychart you to make sure you receive the message.  If you have questions or concerns or don't hear within 5-7 days, please send [place] a message or call us.

## 2019-10-07 ENCOUNTER — Other Ambulatory Visit: Payer: Self-pay

## 2019-10-07 ENCOUNTER — Encounter: Payer: Self-pay | Admitting: Family Medicine

## 2019-10-07 DIAGNOSIS — R739 Hyperglycemia, unspecified: Secondary | ICD-10-CM

## 2019-10-13 ENCOUNTER — Other Ambulatory Visit: Payer: Self-pay

## 2019-10-19 ENCOUNTER — Other Ambulatory Visit: Payer: Self-pay

## 2019-10-20 ENCOUNTER — Other Ambulatory Visit (INDEPENDENT_AMBULATORY_CARE_PROVIDER_SITE_OTHER): Payer: BC Managed Care – PPO

## 2019-10-20 DIAGNOSIS — R739 Hyperglycemia, unspecified: Secondary | ICD-10-CM

## 2019-10-20 LAB — HEMOGLOBIN A1C: Hgb A1c MFr Bld: 5.5 % (ref 4.6–6.5)

## 2019-10-21 ENCOUNTER — Encounter: Payer: Self-pay | Admitting: Family Medicine

## 2019-11-08 DIAGNOSIS — Z01419 Encounter for gynecological examination (general) (routine) without abnormal findings: Secondary | ICD-10-CM | POA: Diagnosis not present

## 2019-11-08 DIAGNOSIS — Z681 Body mass index (BMI) 19 or less, adult: Secondary | ICD-10-CM | POA: Diagnosis not present

## 2019-11-08 LAB — HM PAP SMEAR

## 2019-11-17 ENCOUNTER — Other Ambulatory Visit: Payer: Self-pay | Admitting: Obstetrics and Gynecology

## 2019-11-28 ENCOUNTER — Ambulatory Visit: Payer: BC Managed Care – PPO | Attending: Internal Medicine

## 2019-11-28 DIAGNOSIS — Z20822 Contact with and (suspected) exposure to covid-19: Secondary | ICD-10-CM | POA: Diagnosis not present

## 2019-11-29 LAB — SARS-COV-2, NAA 2 DAY TAT

## 2019-11-29 LAB — NOVEL CORONAVIRUS, NAA: SARS-CoV-2, NAA: NOT DETECTED

## 2019-12-20 DIAGNOSIS — D1801 Hemangioma of skin and subcutaneous tissue: Secondary | ICD-10-CM | POA: Diagnosis not present

## 2019-12-20 DIAGNOSIS — L738 Other specified follicular disorders: Secondary | ICD-10-CM | POA: Diagnosis not present

## 2019-12-20 DIAGNOSIS — L814 Other melanin hyperpigmentation: Secondary | ICD-10-CM | POA: Diagnosis not present

## 2019-12-20 DIAGNOSIS — L573 Poikiloderma of Civatte: Secondary | ICD-10-CM | POA: Diagnosis not present

## 2020-03-18 ENCOUNTER — Encounter: Payer: Self-pay | Admitting: Family Medicine

## 2020-03-19 ENCOUNTER — Ambulatory Visit (INDEPENDENT_AMBULATORY_CARE_PROVIDER_SITE_OTHER): Payer: BC Managed Care – PPO | Admitting: Family Medicine

## 2020-03-19 ENCOUNTER — Encounter: Payer: Self-pay | Admitting: Family Medicine

## 2020-03-19 ENCOUNTER — Other Ambulatory Visit: Payer: Self-pay

## 2020-03-19 VITALS — BP 100/64 | HR 48 | Temp 98.0°F | Ht 67.0 in | Wt 129.4 lb

## 2020-03-19 DIAGNOSIS — M546 Pain in thoracic spine: Secondary | ICD-10-CM

## 2020-03-19 DIAGNOSIS — K222 Esophageal obstruction: Secondary | ICD-10-CM | POA: Insufficient documentation

## 2020-03-19 MED ORDER — MELOXICAM 7.5 MG PO TABS: 7.5000 mg | ORAL_TABLET | Freq: Every day | ORAL | 0 refills | Status: DC

## 2020-03-19 MED ORDER — CYCLOBENZAPRINE HCL 5 MG PO TABS: 5.0000 mg | ORAL_TABLET | Freq: Three times a day (TID) | ORAL | 0 refills | Status: DC

## 2020-03-19 NOTE — Telephone Encounter (Signed)
Patient has been scheduled

## 2020-03-19 NOTE — Progress Notes (Signed)
Phone [phone] In person visit   Subjective:   Stacy Baker is a 64 y.o. year old very pleasant female patient who presents for/with See problem oriented charting Chief Complaint  Patient presents with  . Back Pain   This visit occurred during the SARS-CoV-2 public health emergency.  Safety protocols were in place, including screening questions prior to the visit, additional usage of staff PPE, and extensive cleaning of exam room while observing appropriate contact time as indicated for disinfecting solutions.   Past Medical History-  Patient Active Problem List   Diagnosis Date Noted  . Protein S deficiency (HCC) 09/17/2018    Priority: Medium  . Schatzki's ring of distal esophagus 03/19/2020    Priority: Low  . Osteopenia 07/20/2019    Priority: Low  . PARESTHESIA 12/28/2007    Priority: Low    Medications- reviewed and updated Current Outpatient Medications  Medication Sig Dispense Refill  . aspirin 81 MG tablet Take 81 mg by mouth daily.    . Calcium Carb-Cholecalciferol (CALCIUM 1000 + D) 1000-800 MG-UNIT TABS calcium  1200 mg    . cycloSPORINE (RESTASIS) 0.05 % ophthalmic emulsion Place 1 drop into both eyes 2 (two) times daily.    Marland Kitchen DIVIGEL 1 MG/GM GEL   0  . Multiple Vitamin (MULTIVITAMIN) tablet Take 1 tablet by mouth daily.    . Probiotic Product (PHILLIPS COLON HEALTH PO) Take by mouth daily.    . Psyllium (METAMUCIL FIBER PO) Take 1 each by mouth daily as needed. 1 tbs daily     . VITAMIN D PO Take 1 tablet by mouth daily.    . cyclobenzaprine (FLEXERIL) 5 MG tablet Take 1 tablet (5 mg total) by mouth every 8 (eight) hours. Do not drive for 8hrs after you take 21 tablet 0  . meloxicam (MOBIC) 7.5 MG tablet Take 1 tablet (7.5 mg total) by mouth daily. 7 tablet 0  . Omega-3 Fatty Acids (FISH OIL) 1200 MG CAPS Take by mouth daily.     No current facility-administered medications for this visit.     Objective:  BP 100/64   Pulse (!) 48   Temp 98 F  (36.7 C) (Temporal)   Ht 5\' 7"  (1.702 m)   Wt 129 lb 6.4 oz (58.7 kg)   LMP  (LMP Unknown)   SpO2 99%   BMI 20.27 kg/m  Lungs: nonlabored, normal respiratory rate Abdomen: soft/nondistended  Back - Normal skin, Spine with normal alignment and no deformity.  No tenderness to vertebral process palpation.  Paraspinous muscles are not tender and without spasm except for some spasm in left lower thoracic spine.   Range of motion is full at neck and lumbar sacral regions. Negative Straight leg raise.  Neuro- no saddle anesthesia, 5/5 strength lower extremities,    Assessment and Plan   #Left thoracic back Pain  S: was playing cards over the weekend in twisted for prolonged period playing solitaire. Felt like a twinge at first - but worsened through the night.  She has taken otc meds and taken a warm bath last night that helped so that she could get some rest last night. Prior injury 5-6 years ago. Iced it as well. No fall or injury.   From my chart message on 03/18/2020: Hi Dr. 05/19/2020 and team, I pulled a muscle in my back yesterday (Saturday) and am very uncomfortable and unable to sleep. I pulled my back when I worked at 08-05-2002 and the employee doctor provided a [employer]  and something else - can't remember, maybe Motrin? I need to come in to see Dr. Durene Cal (or a sub) so I can nip this before it gets worse. Please call me first thing so I can get a muscle relaxer.   I am putting ice on it, walking and light stretching. "  Pain rating, quality, Location-up to 8/10. At moment up to 2/10.  Worse with (sitting-related to herniated disc compression)-sitting makes it worse. Laying was worse the first night Relieved by-walking ar ound  Previous imaging- none  ROS-No saddle anesthesia, bladder incontinence, fecal incontinence, weakness in extremity, numbness or tingling in extremity. History negative for trauma, history of cancer, fever, chills, unintentional weight loss, recent bacterial  infection, recent IV drug use, HIV. Only positive- pain worse at night or while supine.   A/P: 64 year old female with left thoracic back pain after sitting in a twisted position for prolonged period playing cards.  Muscular strain noted on exam.  No midline pain.  No major red flags-does think pain is slightly worse in the evening though.  We will give a course of Flexeril-she has done well with muscle relaxants in the past with similar pain.  Prescription given and advised no driving for 8 hours after taking  She has a history of protein S deficiency and is on aspirin to prevent clots.  She has done well with NSAIDs in the past and would like to try this as well-low-dose meloxicam given.  Discussed twofold risk-on aspirin can increase GI bleeding risk and NSAIDs themselves can increase clotting risk.  She will watch out for either the symptoms  Recommended follow up: Return for as needed for new, worsening, persistent symptoms.  Lab/Order associations:   ICD-10-CM   1. Acute left-sided thoracic back pain  M54.6     Meds ordered this encounter  Medications  . cyclobenzaprine (FLEXERIL) 5 MG tablet    Sig: Take 1 tablet (5 mg total) by mouth every 8 (eight) hours. Do not drive for 8hrs after you take    Dispense:  21 tablet    Refill:  0  . meloxicam (MOBIC) 7.5 MG tablet    Sig: Take 1 tablet (7.5 mg total) by mouth daily.    Dispense:  7 tablet    Refill:  0   Return precautions advised.  Tana Conch, MD

## 2020-03-19 NOTE — Patient Instructions (Addendum)
Back Pain:  I would recommend heat 15-20 minutes at least three times a day. You can use a Warm bath or heating pad. You can use ice.   Muscle relaxant: We have called in a prescription for muscle relaxant. Do not drive for 8 hours after you take. Would try to use only before bed time. But can use every 8 hrs as long as does not make you sleepy.    We have called a anti inflammatory for you to take daily for 7 days. If you start having any GI issues you can either stop mediations or add a Pepcid. If you have a any bleeding let our office know immediately.    If you are not better in 2 weeks let our office know.   Continue activity as tolerated. If you are exercising and it becomes painful stop and rest.   If you have any questions or concerns before your next scheduled appointment please give our office a call.    Meloxicam: anti inflammatory   Flexeril: muscle relaxant

## 2020-03-29 ENCOUNTER — Encounter: Payer: Self-pay | Admitting: Family Medicine

## 2020-05-07 ENCOUNTER — Other Ambulatory Visit: Payer: Self-pay | Admitting: Family Medicine

## 2020-05-07 DIAGNOSIS — Z1231 Encounter for screening mammogram for malignant neoplasm of breast: Secondary | ICD-10-CM

## 2020-06-21 ENCOUNTER — Other Ambulatory Visit: Payer: Self-pay

## 2020-06-21 ENCOUNTER — Ambulatory Visit
Admission: RE | Admit: 2020-06-21 | Discharge: 2020-06-21 | Disposition: A | Discharge status: Home / Self Care | Payer: BC Managed Care – PPO | Source: Ambulatory Visit | Attending: Family Medicine | Admitting: Family Medicine

## 2020-06-21 DIAGNOSIS — Z1231 Encounter for screening mammogram for malignant neoplasm of breast: Secondary | ICD-10-CM | POA: Diagnosis not present

## 2020-06-27 DIAGNOSIS — H47022 Hemorrhage in optic nerve sheath, left eye: Secondary | ICD-10-CM | POA: Diagnosis not present

## 2020-06-27 DIAGNOSIS — H524 Presbyopia: Secondary | ICD-10-CM | POA: Diagnosis not present

## 2020-06-27 DIAGNOSIS — H40013 Open angle with borderline findings, low risk, bilateral: Secondary | ICD-10-CM | POA: Diagnosis not present

## 2020-06-27 DIAGNOSIS — H04123 Dry eye syndrome of bilateral lacrimal glands: Secondary | ICD-10-CM | POA: Diagnosis not present

## 2020-08-06 ENCOUNTER — Other Ambulatory Visit: Payer: Self-pay | Admitting: Family Medicine

## 2020-08-25 DIAGNOSIS — Z20822 Contact with and (suspected) exposure to covid-19: Secondary | ICD-10-CM | POA: Diagnosis not present

## 2020-09-26 DIAGNOSIS — H40013 Open angle with borderline findings, low risk, bilateral: Secondary | ICD-10-CM | POA: Diagnosis not present

## 2020-11-20 DIAGNOSIS — Z681 Body mass index (BMI) 19 or less, adult: Secondary | ICD-10-CM | POA: Diagnosis not present

## 2020-11-20 DIAGNOSIS — Z01419 Encounter for gynecological examination (general) (routine) without abnormal findings: Secondary | ICD-10-CM | POA: Diagnosis not present

## 2020-12-07 ENCOUNTER — Other Ambulatory Visit: Payer: Self-pay

## 2020-12-07 ENCOUNTER — Telehealth: Payer: Self-pay

## 2020-12-07 DIAGNOSIS — Z0000 Encounter for general adult medical examination without abnormal findings: Secondary | ICD-10-CM

## 2020-12-07 DIAGNOSIS — D6859 Other primary thrombophilia: Secondary | ICD-10-CM

## 2020-12-07 DIAGNOSIS — R739 Hyperglycemia, unspecified: Secondary | ICD-10-CM

## 2020-12-07 DIAGNOSIS — Z13220 Encounter for screening for lipoid disorders: Secondary | ICD-10-CM

## 2020-12-07 DIAGNOSIS — R7989 Other specified abnormal findings of blood chemistry: Secondary | ICD-10-CM

## 2020-12-07 NOTE — Telephone Encounter (Signed)
LVM asking patient to call back.  

## 2020-12-07 NOTE — Patient Instructions (Addendum)
Schedule labs in 3-4 weeks for repeat CBC just to make sure stable.   Team please add pfizer vaccine June 19 2020  Recommended follow up: Return in about 1 year (around 12/11/2021) for physical or sooner if needed.

## 2020-12-07 NOTE — Telephone Encounter (Signed)
Labs ordered, ok to schedule lab prior.

## 2020-12-07 NOTE — Telephone Encounter (Signed)
Patient called in wondering if she can come in on Monday fasting to have blood work done, aware that she wont have results back in time for appointment but would rather come fasting on Monday.

## 2020-12-07 NOTE — Progress Notes (Signed)
Updating labs

## 2020-12-07 NOTE — Progress Notes (Signed)
Phone [phone]   Subjective:  Patient presents today for their annual physical. Chief complaint-noted.   See problem oriented charting- ROS- full  review of systems was completed and negative except for: ear itch still, some choking schiatzi's ring (advised GI follow up), skin lesion near nose  The following were reviewed and entered/updated in epic: Past Medical History:  Diagnosis Date  . Allergy    Tamiflu , seasonal  . Clotting disorder (HCC)    protein S deficiency   . History of D&C    miscarriage 2001  . History of laparoscopy-assisted vaginal hysterectomy   . Hx of LASIK    bilateral  . Osteopenia   . Varicose veins of both lower extremities    veins injected bilateral  . Wisdom teeth extracted    Patient Active Problem List   Diagnosis Date Noted  . Protein S deficiency (HCC) 09/17/2018    Priority: Medium  . Schatzki's ring of distal esophagus 03/19/2020    Priority: Low  . Osteopenia 07/20/2019    Priority: Low  . PARESTHESIA 12/28/2007    Priority: Low   Past Surgical History:  Procedure Laterality Date  . COLONOSCOPY  07/2008   polyps, 09/2017  . DILATION AND CURETTAGE OF UTERUS  1999   miscarriage  . POLYPECTOMY    . REFRACTIVE SURGERY Bilateral 2007   Lasik  . UPPER GASTROINTESTINAL ENDOSCOPY  08/23/2019   dysphagia  . URETHRAL STRICTURE DILATATION  1978   in high school     Family History  Problem Relation Age of Onset  . Prostate cancer Father        robotic surgery and free 10 years in 2020  . Colon polyps Father   . Stomach cancer Maternal Uncle   . Fibromyalgia Mother   . Arthritis Mother   . Hyperlipidemia Mother   . Hypertension Mother        may be father though  . Hemochromatosis Mother   . Healthy Sister   . Non-Hodgkin's lymphoma Brother        doing well with treatment  . Clotting disorder Maternal Grandfather   . Deep vein thrombosis Brother        didnt dissolve- possible IVC filter  . Glaucoma Paternal  Grandmother   . Colon cancer Neg Hx   . Rectal cancer Neg Hx   . Esophageal cancer Neg Hx     Medications- reviewed and updated Current Outpatient Medications  Medication Sig Dispense Refill  . cycloSPORINE (RESTASIS) 0.05 % ophthalmic emulsion Place 1 drop into both eyes 2 (two) times daily.    . Probiotic Product (PHILLIPS COLON HEALTH PO) Take by mouth daily.    Marland Kitchen aspirin 81 MG tablet Take 81 mg by mouth daily.    . Calcium Carb-Cholecalciferol (CALCIUM 1000 + D) 1000-800 MG-UNIT TABS calcium  1200 mg    . cyclobenzaprine (FLEXERIL) 5 MG tablet TAKE 1 TABLET BY MOUTH EVERY 8 HOURS *DO NOT DRIVE FOR 8 HOURS AFTER YOU TAKE MEDICINE* 21 tablet 0  . DIVIGEL 1 MG/GM GEL   0  . meloxicam (MOBIC) 7.5 MG tablet TAKE 1 TABLET BY MOUTH EVERY DAY 7 tablet 0  . Multiple Vitamin (MULTIVITAMIN) tablet Take 1 tablet by mouth daily.    . Omega-3 Fatty Acids (FISH OIL) 1200 MG CAPS Take by mouth daily.    . Psyllium (METAMUCIL FIBER PO) Take 1 each by mouth daily as needed. 1 tbs daily     . VITAMIN D PO Take 1  tablet by mouth daily.     No current facility-administered medications for this visit.    Allergies-reviewed and updated Allergies  Allergen Reactions  . Tamiflu [Oseltamivir Phosphate] Hives and Itching    Social History   Social History Narrative   Married with 63 year old adopted daughter Myriam Jacobson in January 2020      Elisabeth Cara - now  Semi retired - working 1 day a week. Worked in HR.       Hobbies: exercise, may volunteer at the hospital, travel, volunteer work at [employer]. [place]    Objective  Objective:  BP 107/64   Pulse (!) 57   Temp 98.2 F (36.8 C) (Temporal)   Ht 5\' 7"  (1.702 m)   Wt 124 lb (56.2 kg)   LMP  (LMP Unknown)   SpO2 100%   BMI 19.42 kg/m  Gen: NAD, resting comfortably, thin HEENT: Mucous membranes are moist. Oropharynx normal. TM largely normal- slight excoriation from q tip  Neck: no thyromegaly CV: RRR no murmurs rubs or gallops Lungs: CTAB no  crackles, wheeze, rhonchi Abdomen: soft/nontender/nondistended/normal bowel sounds. No rebound or guarding.  Ext: no edema Skin: warm, dry Neuro: grossly normal, moves all extremities, PERRLA   Assessment and Plan   65 y.o. female presenting for annual physical.  Health Maintenance counseling: 1. Anticipatory guidance: Patient counseled regarding regular dental exams -q6 months, eye exams - yearly,  avoiding smoking and second hand smoke , limiting alcohol to 1 beverage per day .   2. Risk factor reduction:  Advised patient of need for regular exercise and diet rich and fruits and vegetables to reduce risk of heart attack and stroke. Exercise- doing some rowing still but enjoying some lifting. Diet- continuing to eat healthy- does not eat meat- still does eggs/cheese. Discussed could push weight up 5-10 lbs. Could push plant based protein Wt Readings from Last 3 Encounters:  12/11/20 124 lb (56.2 kg)  03/19/20 129 lb 6.4 oz (58.7 kg)  10/06/19 126 lb 3.2 oz (57.2 kg)  3. Immunizations/screenings/ancillary studies- fully up to date other than discussed covid 19 booster - has already had that Immunization History  Administered Date(s) Administered  . Influenza Inj Mdck Quad Pf 05/27/2019  . Influenza,inj,Quad PF,6+ Mos 06/11/2018, 06/03/2019  . PFIZER(Purple Top)SARS-COV-2 Vaccination 11/18/2019, 12/19/2019  . Tdap 06/24/2017  . Zoster Recombinat (Shingrix) 09/21/2018, 01/11/2019  4. Cervical cancer screening- sees Dr. 03/13/2019 with last Pap smear 11-2019 5. Breast cancer screening-  breast exam with GYN and mammogram 06/21/2020 3D 6. Colon cancer screening - September 29, 2018 with 10-year follow-up planned 7. Skin cancer screening-Dr. Amy October 01, 2018 agrees with dermatology previously the patient had plans to switch last year- now with Dr. [place] . advised regular sunscreen use. Denies worrisome, changing, or new skin lesions.  8. Birth control/STD check- postmenopausal and monogamous 9.  Osteoporosis screening at 67-known osteopenia see below -Never smoker  Status of chronic or acute concerns   #social update- dad did not do well last year after having covid 19 at age 14. Mom was recovering better but both hospitalized. Ended up losing both parents  #leukopenia- very mild plus platelets slightly low. Schedule labs in 3-4 weeks for repeat CBC just to make sure stable.   #08/23/2019- Schatzki's ring dilation. Hasddone better since that time but worsening over last few months- advised follow up. She agrees to call  #Ear itching -mainly on the left- exam normal other than slight excoriation from q tip- advised to stop. Mineral oil helps  only slightly. Declines ENT  #rash on face- About a week or two ago started with some itchy red bumps under nose. aquaphor not helping. Recommended trial cortisone 1% OTC twice a day for seven days  Protein S deficiency-no history of DVT or PE.  Patient remained on aspirin last year but stopped- agrees to restart. .  We had discussed being on estradiol can increase risk of clot.  No leg swelling or shortness of breath at present-restart aspirin   #Osteopenia of right femur neck-patient on calcium and vitamin D (stopped vitamin D a month ago- and will restart) and working on weightbearing exercise as of last year.  Recalculated FRAX score last year at 11% total fracture and 1.1% for the hip.  Last done 07/20/2019 with consideration of repeat in 1 year  Mild hyperlipidemia-not in range to consider cholesterol medicine with ASCVD risk below 2.5 %  Left sided thoracic back pain in July 2021- did well for long time. Slight tweak this AM when woke up but didn't have to take meds- and better even with exercise  Hyperglycemia- has had slightly high cbg in past but a1cs normal- will go back to checking just CBGs  Recommended follow up: Return in about 1 year (around 12/11/2021) for physical or sooner if needed.  Lab/Order associations: fasting labs  yesterday.    ICD-10-CM   1. Preventative health care  Z00.00     No orders of the defined types were placed in this encounter.   Return precautions advised.  Tana Conch, MD

## 2020-12-10 ENCOUNTER — Other Ambulatory Visit: Payer: Self-pay

## 2020-12-10 ENCOUNTER — Other Ambulatory Visit (INDEPENDENT_AMBULATORY_CARE_PROVIDER_SITE_OTHER): Payer: BC Managed Care – PPO

## 2020-12-10 DIAGNOSIS — R739 Hyperglycemia, unspecified: Secondary | ICD-10-CM | POA: Diagnosis not present

## 2020-12-10 DIAGNOSIS — Z13220 Encounter for screening for lipoid disorders: Secondary | ICD-10-CM | POA: Diagnosis not present

## 2020-12-10 DIAGNOSIS — R7989 Other specified abnormal findings of blood chemistry: Secondary | ICD-10-CM | POA: Diagnosis not present

## 2020-12-10 DIAGNOSIS — Z0000 Encounter for general adult medical examination without abnormal findings: Secondary | ICD-10-CM

## 2020-12-10 DIAGNOSIS — D6859 Other primary thrombophilia: Secondary | ICD-10-CM | POA: Diagnosis not present

## 2020-12-10 LAB — CBC WITH DIFFERENTIAL/PLATELET
Basophils Absolute: 0 10*3/uL (ref 0.0–0.1)
Basophils Relative: 0.7 % (ref 0.0–3.0)
Eosinophils Absolute: 0.2 10*3/uL (ref 0.0–0.7)
Eosinophils Relative: 7.2 % — ABNORMAL HIGH (ref 0.0–5.0)
HCT: 40.5 % (ref 36.0–46.0)
Hemoglobin: 13.7 g/dL (ref 12.0–15.0)
Lymphocytes Relative: 30.2 % (ref 12.0–46.0)
Lymphs Abs: 0.9 10*3/uL (ref 0.7–4.0)
MCHC: 33.9 g/dL (ref 30.0–36.0)
MCV: 92.7 fl (ref 78.0–100.0)
Monocytes Absolute: 0.2 10*3/uL (ref 0.1–1.0)
Monocytes Relative: 6.7 % (ref 3.0–12.0)
Neutro Abs: 1.7 10*3/uL (ref 1.4–7.7)
Neutrophils Relative %: 55.2 % (ref 43.0–77.0)
Platelets: 143 10*3/uL — ABNORMAL LOW (ref 150.0–400.0)
RBC: 4.37 Mil/uL (ref 3.87–5.11)
RDW: 13.4 % (ref 11.5–15.5)
WBC: 3.1 10*3/uL — ABNORMAL LOW (ref 4.0–10.5)

## 2020-12-10 LAB — COMPREHENSIVE METABOLIC PANEL
ALT: 11 U/L (ref 0–35)
AST: 15 U/L (ref 0–37)
Albumin: 4 g/dL (ref 3.5–5.2)
Alkaline Phosphatase: 41 U/L (ref 39–117)
BUN: 11 mg/dL (ref 6–23)
CO2: 28 mEq/L (ref 19–32)
Calcium: 8.7 mg/dL (ref 8.4–10.5)
Chloride: 105 mEq/L (ref 96–112)
Creatinine, Ser: 0.83 mg/dL (ref 0.40–1.20)
GFR: 75.59 mL/min (ref >60.00)
Glucose, Bld: 92 mg/dL (ref 70–99)
Potassium: 3.9 mEq/L (ref 3.5–5.1)
Sodium: 140 mEq/L (ref 135–145)
Total Bilirubin: 0.6 mg/dL (ref 0.2–1.2)
Total Protein: 5.8 g/dL — ABNORMAL LOW (ref 6.0–8.3)

## 2020-12-10 LAB — LIPID PANEL
Cholesterol: 162 mg/dL (ref 0–200)
HDL: 68.5 mg/dL (ref >39.00)
LDL Cholesterol: 80 mg/dL (ref 0–99)
NonHDL: 93.49
Total CHOL/HDL Ratio: 2
Triglycerides: 68 mg/dL (ref 0.0–149.0)
VLDL: 13.6 mg/dL (ref 0.0–40.0)

## 2020-12-10 LAB — VITAMIN D 25 HYDROXY (VIT D DEFICIENCY, FRACTURES): VITD: 30.28 ng/mL (ref 30.00–100.00)

## 2020-12-10 LAB — HEMOGLOBIN A1C: Hgb A1c MFr Bld: 5.3 % (ref 4.6–6.5)

## 2020-12-11 ENCOUNTER — Encounter: Payer: Self-pay | Admitting: Family Medicine

## 2020-12-11 ENCOUNTER — Ambulatory Visit (INDEPENDENT_AMBULATORY_CARE_PROVIDER_SITE_OTHER): Payer: BC Managed Care – PPO | Admitting: Family Medicine

## 2020-12-11 VITALS — BP 107/64 | HR 57 | Temp 98.2°F | Ht 67.0 in | Wt 124.0 lb

## 2020-12-11 DIAGNOSIS — Z0000 Encounter for general adult medical examination without abnormal findings: Secondary | ICD-10-CM | POA: Diagnosis not present

## 2020-12-11 DIAGNOSIS — D72819 Decreased white blood cell count, unspecified: Secondary | ICD-10-CM | POA: Diagnosis not present

## 2021-01-02 ENCOUNTER — Telehealth: Payer: Self-pay | Admitting: Gastroenterology

## 2021-01-02 NOTE — Telephone Encounter (Signed)
Inbound call from patient requesting a call from a nurse.  Did not specify further.

## 2021-01-03 DIAGNOSIS — L218 Other seborrheic dermatitis: Secondary | ICD-10-CM | POA: Diagnosis not present

## 2021-01-03 DIAGNOSIS — D485 Neoplasm of uncertain behavior of skin: Secondary | ICD-10-CM | POA: Diagnosis not present

## 2021-01-03 DIAGNOSIS — D2239 Melanocytic nevi of other parts of face: Secondary | ICD-10-CM | POA: Diagnosis not present

## 2021-01-03 DIAGNOSIS — L821 Other seborrheic keratosis: Secondary | ICD-10-CM | POA: Diagnosis not present

## 2021-01-03 DIAGNOSIS — L814 Other melanin hyperpigmentation: Secondary | ICD-10-CM | POA: Diagnosis not present

## 2021-01-03 DIAGNOSIS — D1801 Hemangioma of skin and subcutaneous tissue: Secondary | ICD-10-CM | POA: Diagnosis not present

## 2021-01-03 NOTE — Telephone Encounter (Signed)
The pt wanted to ask if her husband could have a direct colon or if he would need to have an office visit.  She states he has some rectal bleeding.  I have advised her that he should call the office to make an appt.  The pt has been advised of the information and verbalized understanding.

## 2021-01-04 ENCOUNTER — Encounter: Payer: Self-pay | Admitting: Family Medicine

## 2021-01-04 DIAGNOSIS — Z8349 Family history of other endocrine, nutritional and metabolic diseases: Secondary | ICD-10-CM

## 2021-01-10 ENCOUNTER — Other Ambulatory Visit: Payer: BC Managed Care – PPO

## 2021-01-10 ENCOUNTER — Other Ambulatory Visit (INDEPENDENT_AMBULATORY_CARE_PROVIDER_SITE_OTHER): Payer: BC Managed Care – PPO

## 2021-01-10 ENCOUNTER — Other Ambulatory Visit: Payer: Self-pay

## 2021-01-10 DIAGNOSIS — Z8349 Family history of other endocrine, nutritional and metabolic diseases: Secondary | ICD-10-CM

## 2021-01-10 DIAGNOSIS — D72819 Decreased white blood cell count, unspecified: Secondary | ICD-10-CM | POA: Diagnosis not present

## 2021-01-10 LAB — CBC WITH DIFFERENTIAL/PLATELET
Basophils Absolute: 0 10*3/uL (ref 0.0–0.1)
Basophils Relative: 0.7 % (ref 0.0–3.0)
Eosinophils Absolute: 0.1 10*3/uL (ref 0.0–0.7)
Eosinophils Relative: 2.3 % (ref 0.0–5.0)
HCT: 39.1 % (ref 36.0–46.0)
Hemoglobin: 13.3 g/dL (ref 12.0–15.0)
Lymphocytes Relative: 22.5 % (ref 12.0–46.0)
Lymphs Abs: 1.2 10*3/uL (ref 0.7–4.0)
MCHC: 34 g/dL (ref 30.0–36.0)
MCV: 91.3 fl (ref 78.0–100.0)
Monocytes Absolute: 0.3 10*3/uL (ref 0.1–1.0)
Monocytes Relative: 5.1 % (ref 3.0–12.0)
Neutro Abs: 3.7 10*3/uL (ref 1.4–7.7)
Neutrophils Relative %: 69.4 % (ref 43.0–77.0)
Platelets: 193 10*3/uL (ref 150.0–400.0)
RBC: 4.29 Mil/uL (ref 3.87–5.11)
RDW: 13.3 % (ref 11.5–15.5)
WBC: 5.4 10*3/uL (ref 4.0–10.5)

## 2021-01-10 LAB — IBC + FERRITIN
Ferritin: 45.4 ng/mL (ref 10.0–291.0)
Iron: 90 ug/dL (ref 42–145)
Saturation Ratios: 30.5 % (ref 20.0–50.0)
Transferrin: 211 mg/dL — ABNORMAL LOW (ref 212.0–360.0)

## 2021-07-01 DIAGNOSIS — H52203 Unspecified astigmatism, bilateral: Secondary | ICD-10-CM | POA: Diagnosis not present

## 2021-07-01 DIAGNOSIS — H2513 Age-related nuclear cataract, bilateral: Secondary | ICD-10-CM | POA: Diagnosis not present

## 2021-07-01 DIAGNOSIS — H40013 Open angle with borderline findings, low risk, bilateral: Secondary | ICD-10-CM | POA: Diagnosis not present

## 2021-07-01 DIAGNOSIS — H04123 Dry eye syndrome of bilateral lacrimal glands: Secondary | ICD-10-CM | POA: Diagnosis not present

## 2021-07-22 ENCOUNTER — Other Ambulatory Visit: Payer: Self-pay | Admitting: Family Medicine

## 2021-07-22 DIAGNOSIS — Z1231 Encounter for screening mammogram for malignant neoplasm of breast: Secondary | ICD-10-CM

## 2021-08-06 DIAGNOSIS — Z13820 Encounter for screening for osteoporosis: Secondary | ICD-10-CM | POA: Diagnosis not present

## 2021-08-06 LAB — HM DEXA SCAN

## 2021-08-23 ENCOUNTER — Ambulatory Visit: Payer: BC Managed Care – PPO

## 2021-08-27 ENCOUNTER — Encounter: Payer: Self-pay | Admitting: Family Medicine

## 2021-09-19 ENCOUNTER — Ambulatory Visit
Admission: RE | Admit: 2021-09-19 | Discharge: 2021-09-19 | Disposition: A | Discharge status: Home / Self Care | Payer: BC Managed Care – PPO | Source: Ambulatory Visit | Attending: Family Medicine | Admitting: Family Medicine

## 2021-09-19 DIAGNOSIS — Z1231 Encounter for screening mammogram for malignant neoplasm of breast: Secondary | ICD-10-CM

## 2021-11-13 NOTE — Progress Notes (Incomplete)
Phone [phone]   Subjective:  Patient presents today for their annual physical. Chief complaint-noted.   See problem oriented charting- ROS- full  review of systems was completed and negative except for: ***  The following were reviewed and entered/updated in epic: Past Medical History:  Diagnosis Date   Allergy    Tamiflu , seasonal   Clotting disorder (HCC)    protein S deficiency    History of D&C    miscarriage 2001   History of laparoscopy-assisted vaginal hysterectomy    Hx of LASIK    bilateral   Osteopenia    Varicose veins of both lower extremities    veins injected bilateral   Wisdom teeth extracted    Patient Active Problem List   Diagnosis Date Noted   Schatzki's ring of distal esophagus 03/19/2020   Osteopenia 07/20/2019   Protein S deficiency (HCC) 09/17/2018   PARESTHESIA 12/28/2007   Past Surgical History:  Procedure Laterality Date   COLONOSCOPY  07/2008   polyps, 09/2017   DILATION AND CURETTAGE OF UTERUS  1999   miscarriage   POLYPECTOMY     REFRACTIVE SURGERY Bilateral 2007   Lasik   UPPER GASTROINTESTINAL ENDOSCOPY  08/23/2019   dysphagia   URETHRAL STRICTURE DILATATION  1978   in high school     Family History  Problem Relation Age of Onset   Prostate cancer Father        robotic surgery and free 10 years in 2020   Colon polyps Father    Other Father        complications after covid0 over 88   Stomach cancer Maternal Uncle    Fibromyalgia Mother    Arthritis Mother    Hyperlipidemia Mother    Hypertension Mother        may be father though   Hemochromatosis Mother    Other Mother        over 38- died 8 months after husband passed   Healthy Sister    Non-Hodgkin's lymphoma Brother        doing well with treatment   Clotting disorder Maternal Grandfather    Deep vein thrombosis Brother        didnt dissolve- possible IVC filter   Glaucoma Paternal Grandmother    Colon cancer Neg Hx    Rectal cancer Neg Hx     Esophageal cancer Neg Hx     Medications- reviewed and updated Current Outpatient Medications  Medication Sig Dispense Refill   aspirin 81 MG tablet Take 81 mg by mouth daily.     Calcium Carb-Cholecalciferol (CALCIUM 1000 + D) 1000-800 MG-UNIT TABS calcium  1200 mg     cyclobenzaprine (FLEXERIL) 5 MG tablet TAKE 1 TABLET BY MOUTH EVERY 8 HOURS *DO NOT DRIVE FOR 8 HOURS AFTER YOU TAKE MEDICINE* 21 tablet 0   cycloSPORINE (RESTASIS) 0.05 % ophthalmic emulsion Place 1 drop into both eyes 2 (two) times daily.     DIVIGEL 1 MG/GM GEL   0   meloxicam (MOBIC) 7.5 MG tablet TAKE 1 TABLET BY MOUTH EVERY DAY 7 tablet 0   Multiple Vitamin (MULTIVITAMIN) tablet Take 1 tablet by mouth daily.     Omega-3 Fatty Acids (FISH OIL) 1200 MG CAPS Take by mouth daily.     Probiotic Product (PHILLIPS COLON HEALTH PO) Take by mouth daily.     Psyllium (METAMUCIL FIBER PO) Take 1 each by mouth daily as needed. 1 tbs daily      VITAMIN D PO  Take 1 tablet by mouth daily.     No current facility-administered medications for this visit.    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 [place]    Objective  Objective:  LMP  (LMP Unknown)  Gen: NAD, resting comfortably HEENT: Mucous membranes are moist. Oropharynx normal Neck: no thyromegaly CV: RRR no murmurs rubs or gallops Lungs: CTAB no crackles, wheeze, rhonchi Abdomen: soft/nontender/nondistended/normal bowel sounds. No rebound or guarding.  Ext: no edema Skin: warm, dry Neuro: grossly normal, moves all extremities, PERRLA***   Assessment and Plan   66 y.o. female  presenting for annual physical.  Health Maintenance counseling: 1. Anticipatory guidance: Patient counseled regarding regular dental exams ***q6 months, eye exams ***,  avoiding smoking and second hand smoke*** , limiting alcohol to 1 beverage per day*** .  No illicit drugs - *** 2. Risk factor reduction:  Advised patient of need for regular exercise and diet rich and fruits and vegetables to reduce risk of heart attack and stroke.  Exercise- doing some rowing still but enjoying some lifting***.  Diet-continuing to eat healthy- does not eat meat- still does eggs/cheese. Discussed could push weight up 5-10 lbs. Could push plant based protein-***.  Wt Readings from Last 3 Encounters:  12/11/20 124 lb (56.2 kg)  03/19/20 129 lb 6.4 oz (58.7 kg)  10/06/19 126 lb 3.2 oz (57.2 kg)   3. Immunizations/screenings/ancillary studies DISCUSSED:  -COVID booster vaccination - *** Immunization History  Administered Date(s) Administered   Influenza Inj Mdck Quad Pf 05/27/2019   Influenza,inj,Quad PF,6+ Mos 06/11/2018, 06/03/2019, 07/15/2021   PFIZER(Purple Top)SARS-COV-2 Vaccination 11/18/2019, 12/19/2019, 06/19/2020   Tdap 06/24/2017   Zoster Recombinat (Shingrix) 09/21/2018, 01/11/2019   Health Maintenance Due  Topic Date Due   COVID-19 Vaccine (4 - Booster for Pfizer series) 08/14/2020   4. Cervical cancer screening- pap smear 11/08/19 with 3 year repeat planned*** 5. Breast cancer screening-  breast exam with GYN *** and mammogram 09/19/21 with 1 year repeat planned 6. Colon cancer screening -09/29/18 with 10 year repeat planned *** 7. Skin cancer screening- Dr. Amy Swaziland agrees with dermatology previously the patient had plans to switch last year- now with Dr. Roderic Scarce . ***advised regular sunscreen use. Denies worrisome, changing, or new skin lesions.  8. Birth control/STD check- postmenopausal and monogamous*** 9. Osteoporosis screening at 64- DEXA 08/06/2021 with 2 year repeat planned*** -never  smoker - ***  Status of chronic or acute concerns   08/23/2019***possible Schatzki's ring dilation  #social update- dad did not do well last year after having covid 19 at age 72. Mom was recovered better but both hospitalized. Ended up losing both parents   #leukopenia- very mild plus platelets slightly low. Schedule labs in 3-4 weeks for repeat CBC just to make sure stable.    #08/23/2019- Schatzki's ring dilation. Had done better since that time but worsened over last few months- advised follow up. She agreed to call   #Ear itching -mainly on the left- exam normal other than slight excoriation from q tip- advised to stop. Mineral oil helps only slightly.  Declined ENT   #rash on face- in 2022, started with some itchy red bumps under nose. aquaphor were not helpful. Recommended trial cortisone 1% OTC twice a day for seven days   Protein S deficiency-no history of DVT or PE.  Patient remained on aspirin last year but stopped- agrees to restart.We had discussed being on estradiol can increase risk of clot.  No leg swelling or shortness of breath at present-restart aspirin    #Osteopenia of right femur neck-patient on calcium and vitamin D (stopped vitamin D a month ago- and will restart) and worked on weightbearing exercise as of last year.  Recalculated FRAX score last year at 11% total fracture and 1.1% for the hip.  Last done 07/20/2019 with consideration of repeat in 1 year   Mild hyperlipidemia-not in range to consider cholesterol medicine with ASCVD risk below 2.5 % Lab Results  Component Value Date   CHOL 162 12/10/2020   HDL 68.50 12/10/2020   LDLCALC 80 12/10/2020   TRIG 68.0 12/10/2020   CHOLHDL 2 12/10/2020   A/P: ***   Left sided thoracic back pain in July 2021- did well for long time. Slight tweak this AM when woke up but didn't have to take meds- and better even with exercise   Hyperglycemia- had slightly high cbg in past but a1cs normal- will go back to checking just  CBGs Exercise and diet- *** Lab Results  Component Value Date   HGBA1C 5.3 12/10/2020   HGBA1C 5.5 10/20/2019    Recommended follow up: No follow-ups on file. Future Appointments  Date Time Provider Department Center  12/12/2021  2:40 PM Shelva MajesticHunter, Stephen O, MD [facility]    No chief complaint on file.  Lab/Order associations:*** fasting No diagnosis found.  No orders of the defined types were placed in this encounter.   I,Jada Bradford,acting as a scribe for Tana ConchStephen Hunter, MD.,have documented all relevant documentation on the behalf of Tana ConchStephen Hunter, MD,as directed by  Tana ConchStephen Hunter, MD while in the presence of Tana ConchStephen Hunter, MD.  *** Return precautions advised.  Scheryl MartenJada Bradford

## 2021-11-25 DIAGNOSIS — Z01419 Encounter for gynecological examination (general) (routine) without abnormal findings: Secondary | ICD-10-CM | POA: Diagnosis not present

## 2021-11-25 DIAGNOSIS — Z681 Body mass index (BMI) 19 or less, adult: Secondary | ICD-10-CM | POA: Diagnosis not present

## 2021-12-10 ENCOUNTER — Telehealth: Payer: Self-pay

## 2021-12-10 ENCOUNTER — Other Ambulatory Visit: Payer: Self-pay

## 2021-12-10 DIAGNOSIS — E785 Hyperlipidemia, unspecified: Secondary | ICD-10-CM

## 2021-12-10 DIAGNOSIS — R7989 Other specified abnormal findings of blood chemistry: Secondary | ICD-10-CM

## 2021-12-10 DIAGNOSIS — Z0000 Encounter for general adult medical examination without abnormal findings: Secondary | ICD-10-CM

## 2021-12-10 DIAGNOSIS — R739 Hyperglycemia, unspecified: Secondary | ICD-10-CM

## 2021-12-10 NOTE — Telephone Encounter (Signed)
Orders placed, ok to schedule lab visit. 

## 2021-12-10 NOTE — Telephone Encounter (Signed)
Patient states she has cpe scheduled for 4/6.  Would like ot know if Dr. Durene Cal could place lab orders to have completed 4/5?

## 2021-12-11 ENCOUNTER — Other Ambulatory Visit (INDEPENDENT_AMBULATORY_CARE_PROVIDER_SITE_OTHER): Payer: BC Managed Care – PPO

## 2021-12-11 DIAGNOSIS — R7989 Other specified abnormal findings of blood chemistry: Secondary | ICD-10-CM

## 2021-12-11 DIAGNOSIS — R739 Hyperglycemia, unspecified: Secondary | ICD-10-CM

## 2021-12-11 DIAGNOSIS — Z0000 Encounter for general adult medical examination without abnormal findings: Secondary | ICD-10-CM | POA: Diagnosis not present

## 2021-12-11 DIAGNOSIS — E785 Hyperlipidemia, unspecified: Secondary | ICD-10-CM | POA: Diagnosis not present

## 2021-12-11 LAB — COMPREHENSIVE METABOLIC PANEL
ALT: 14 U/L (ref 0–35)
AST: 21 U/L (ref 0–37)
Albumin: 4.3 g/dL (ref 3.5–5.2)
Alkaline Phosphatase: 43 U/L (ref 39–117)
BUN: 15 mg/dL (ref 6–23)
CO2: 28 mEq/L (ref 19–32)
Calcium: 8.9 mg/dL (ref 8.4–10.5)
Chloride: 102 mEq/L (ref 96–112)
Creatinine, Ser: 0.77 mg/dL (ref 0.40–1.20)
GFR: 82.13 mL/min (ref >60.00)
Glucose, Bld: 79 mg/dL (ref 70–99)
Potassium: 3.9 mEq/L (ref 3.5–5.1)
Sodium: 136 mEq/L (ref 135–145)
Total Bilirubin: 0.7 mg/dL (ref 0.2–1.2)
Total Protein: 6.3 g/dL (ref 6.0–8.3)

## 2021-12-11 LAB — LIPID PANEL
Cholesterol: 181 mg/dL (ref 0–200)
HDL: 73.4 mg/dL (ref >39.00)
LDL Cholesterol: 93 mg/dL (ref 0–99)
NonHDL: 107.57
Total CHOL/HDL Ratio: 2
Triglycerides: 71 mg/dL (ref 0.0–149.0)
VLDL: 14.2 mg/dL (ref 0.0–40.0)

## 2021-12-11 LAB — CBC WITH DIFFERENTIAL/PLATELET
Basophils Absolute: 0 10*3/uL (ref 0.0–0.1)
Basophils Relative: 0.7 % (ref 0.0–3.0)
Eosinophils Absolute: 0.2 10*3/uL (ref 0.0–0.7)
Eosinophils Relative: 4 % (ref 0.0–5.0)
HCT: 41.1 % (ref 36.0–46.0)
Hemoglobin: 13.8 g/dL (ref 12.0–15.0)
Lymphocytes Relative: 22.8 % (ref 12.0–46.0)
Lymphs Abs: 1.1 10*3/uL (ref 0.7–4.0)
MCHC: 33.6 g/dL (ref 30.0–36.0)
MCV: 94.2 fl (ref 78.0–100.0)
Monocytes Absolute: 0.2 10*3/uL (ref 0.1–1.0)
Monocytes Relative: 5.3 % (ref 3.0–12.0)
Neutro Abs: 3.1 10*3/uL (ref 1.4–7.7)
Neutrophils Relative %: 67.2 % (ref 43.0–77.0)
Platelets: 165 10*3/uL (ref 150.0–400.0)
RBC: 4.36 Mil/uL (ref 3.87–5.11)
RDW: 13.7 % (ref 11.5–15.5)
WBC: 4.7 10*3/uL (ref 4.0–10.5)

## 2021-12-11 LAB — VITAMIN D 25 HYDROXY (VIT D DEFICIENCY, FRACTURES): VITD: 37.3 ng/mL (ref 30.00–100.00)

## 2021-12-11 LAB — HEMOGLOBIN A1C: Hgb A1c MFr Bld: 5.4 % (ref 4.6–6.5)

## 2021-12-12 ENCOUNTER — Encounter: Payer: Self-pay | Admitting: Family Medicine

## 2021-12-12 ENCOUNTER — Ambulatory Visit (INDEPENDENT_AMBULATORY_CARE_PROVIDER_SITE_OTHER): Payer: BC Managed Care – PPO | Admitting: Family Medicine

## 2021-12-12 VITALS — BP 92/60 | HR 54 | Temp 98.1°F | Ht 67.0 in | Wt 125.4 lb

## 2021-12-12 DIAGNOSIS — E782 Mixed hyperlipidemia: Secondary | ICD-10-CM

## 2021-12-12 DIAGNOSIS — D72819 Decreased white blood cell count, unspecified: Secondary | ICD-10-CM

## 2021-12-12 DIAGNOSIS — D6859 Other primary thrombophilia: Secondary | ICD-10-CM | POA: Diagnosis not present

## 2021-12-12 DIAGNOSIS — Z0000 Encounter for general adult medical examination without abnormal findings: Secondary | ICD-10-CM | POA: Diagnosis not present

## 2021-12-12 DIAGNOSIS — M85851 Other specified disorders of bone density and structure, right thigh: Secondary | ICD-10-CM

## 2021-12-12 DIAGNOSIS — R739 Hyperglycemia, unspecified: Secondary | ICD-10-CM | POA: Diagnosis not present

## 2021-12-12 MED ORDER — TRETINOIN 0.05 % EX GEL: 1.0000 "application " | Freq: Every day | CUTANEOUS | 3 refills | Status: DC

## 2021-12-12 NOTE — Patient Instructions (Addendum)
Health Maintenance Due  ?Topic Date Due  ? COVID-19 Vaccine (4 - Booster for Pfizer series) 08/14/2020  ?Send [place] dates of your covid shot last one ? ?Thanks for already doing labs ? ?You are doing fantastic ? ?Recommended follow up: Return in about 1 year (around 12/13/2022) for physical or sooner if needed.Schedule b4 you leave. ?

## 2021-12-25 DIAGNOSIS — H04211 Epiphora due to excess lacrimation, right lacrimal gland: Secondary | ICD-10-CM | POA: Diagnosis not present

## 2021-12-25 DIAGNOSIS — H16201 Unspecified keratoconjunctivitis, right eye: Secondary | ICD-10-CM | POA: Diagnosis not present

## 2022-01-13 DIAGNOSIS — L738 Other specified follicular disorders: Secondary | ICD-10-CM | POA: Diagnosis not present

## 2022-01-13 DIAGNOSIS — L438 Other lichen planus: Secondary | ICD-10-CM | POA: Diagnosis not present

## 2022-01-13 DIAGNOSIS — L814 Other melanin hyperpigmentation: Secondary | ICD-10-CM | POA: Diagnosis not present

## 2022-01-13 DIAGNOSIS — L821 Other seborrheic keratosis: Secondary | ICD-10-CM | POA: Diagnosis not present

## 2022-01-13 DIAGNOSIS — L570 Actinic keratosis: Secondary | ICD-10-CM | POA: Diagnosis not present

## 2022-03-26 ENCOUNTER — Telehealth: Payer: Self-pay | Admitting: Family Medicine

## 2022-03-26 MED ORDER — MELOXICAM 7.5 MG PO TABS: 7.5000 mg | ORAL_TABLET | Freq: Every day | ORAL | 0 refills | Status: DC

## 2022-03-26 MED ORDER — CYCLOBENZAPRINE HCL 5 MG PO TABS: ORAL_TABLET | ORAL | 0 refills | Status: AC

## 2022-03-26 NOTE — Telephone Encounter (Signed)
Rx refilled.

## 2022-03-26 NOTE — Telephone Encounter (Signed)
Pt is going out of town this afternoon at [employer].   . Encourage patient to contact the pharmacy for refills or they can request refills through [facility]  LAST APPOINTMENT DATE:  12/12/21  NEXT APPOINTMENT DATE: 12/18/22  MEDICATION:cyclobenzaprine (FLEXERIL) 5 MG tablet  meloxicam (MOBIC) 7.5 MG tablet  Is the patient out of medication? yes  PHARMACY: [id number] [place], [place] - [address] Phone:  [phone]  Fax:  [phone]      Let patient know to contact pharmacy at the end of the day to make sure medication is ready.  Please notify patient to allow 48-72 hours to process

## 2022-06-02 ENCOUNTER — Encounter: Payer: Self-pay | Admitting: *Deleted

## 2022-08-04 ENCOUNTER — Encounter: Payer: Self-pay | Admitting: Internal Medicine

## 2022-08-04 ENCOUNTER — Encounter: Payer: Self-pay | Admitting: Family Medicine

## 2022-08-04 ENCOUNTER — Ambulatory Visit (INDEPENDENT_AMBULATORY_CARE_PROVIDER_SITE_OTHER): Payer: 59 | Admitting: Internal Medicine

## 2022-08-04 VITALS — BP 106/66 | HR 49 | Temp 98.3°F | Ht 67.0 in | Wt 125.0 lb

## 2022-08-04 DIAGNOSIS — J320 Chronic maxillary sinusitis: Secondary | ICD-10-CM | POA: Diagnosis not present

## 2022-08-04 DIAGNOSIS — M7711 Lateral epicondylitis, right elbow: Secondary | ICD-10-CM | POA: Diagnosis not present

## 2022-08-04 DIAGNOSIS — J328 Other chronic sinusitis: Secondary | ICD-10-CM

## 2022-08-04 MED ORDER — AMOXICILLIN-POT CLAVULANATE 875-125 MG PO TABS: 1.0000 | ORAL_TABLET | Freq: Two times a day (BID) | ORAL | 0 refills | Status: DC

## 2022-08-04 NOTE — Progress Notes (Addendum)
[facility]: [id number]  Routine Medical Office Visit  Patient:  Stacy Baker      Age: 66 y.o.       Sex:  female  Date:   08/04/2022  PCP:    Shelva Majestic, MD    Today's Healthcare Provider: Lula Olszewski, MD  Assessment/Plan:   Stacy Baker was seen today for sinusitis.  Other chronic sinusitis  Chronic maxillary sinusitis Overview: Historically Gets recurring sinusitis 2-3 x yearly Has been advised to go on probiotics to prevent and its been rare since for past 5 years  No history of immunosuppression but does have clotting Disorder Had flu and COVID shots 1 month(s) ago, and only minimal sore throat so declines testing She is producing nonbloody, brownish, yellowish, nasty, secretions- seem purulent we looked at images.. She denies myalgias or fever.   Assessment & Plan: Gave Augmentin after discussing risks and benefits and alternative and advising waiting until 7 days symptomatic  - though she does have purulent drainage now. Advised using nasal aerosol mist sterile saline to clear out sinuses of pollen and pathogens, both now and on an ongoing daily basis.   Orders: -     Amoxicillin-Pot Clavulanate; Take 1 tablet by mouth 2 (two) times daily.  Dispense: 20 tablet; Refill: 0  Lateral epicondylitis of right elbow Overview: Due to repetitive strain R elbow. Reported 1-2 month(s) duration at 07/2022 visit.  Assessment & Plan: We went over the conservative treatment recommendations: Activity modification (reduction or abstinence from inciting activities) Counterforce bracing (some patients prefer compression sleeve) Oral analgesics: acetaminophen, nonsteroidal antiinflammatory drugs (NSAIDs) if not contraindicated Physical therapy, including wrist mobility and eccentric strength exercises Ice applied to affected epicondyle area after provocative activity Advised to let us know if this doesn't resolve within a few months at  which point we can set up sports medicine or imaging.         Subjective:   Stacy Baker is a 66 y.o. female with past medical history including: Past Medical History:  Diagnosis Date   Allergy    Tamiflu , seasonal   Clotting disorder (HCC)    protein S deficiency    History of D&C    miscarriage 2001   History of laparoscopy-assisted vaginal hysterectomy    Hx of LASIK    bilateral   Osteopenia    Varicose veins of both lower extremities    veins injected bilateral   Wisdom teeth extracted      She presented today reporting reason for visit as: Chief Complaint  Patient presents with   Sinusitis    Pt states sx started Wednesday, pt sx is cough and a lot of drainage, not clear. Pt states sore throat and headache from coughing and blowing nose.            Objective:  Physical Exam: BP 106/66 (BP Location: Left Arm, Patient Position: Sitting)   Pulse (!) 49   Temp 98.3 F (36.8 C) (Temporal)   Ht 5\' 7"  (1.702 m)   Wt 125 lb (56.7 kg)   LMP  (LMP Unknown)   SpO2 100%   BMI 19.58 kg/m   Problem-specific physical exam findings:  Physical Exam Vitals and nursing note reviewed.  Constitutional:      General: She is not in acute distress.    Appearance: Normal appearance. She is not ill-appearing, toxic-appearing or diaphoretic.  HENT:     Head: Normocephalic and atraumatic.     Nose: Nose  normal.     Mouth/Throat:     Mouth: Mucous membranes are moist.  Eyes:     General: No scleral icterus.    Conjunctiva/sclera: Conjunctivae normal.  Pulmonary:     Effort: Pulmonary effort is normal. No respiratory distress.  Neurological:     Mental Status: She is alert.  Psychiatric:        Mood and Affect: Mood normal.        Behavior: Behavior normal.    ENT exam was normal but limited nasal exam due to ticklishness with the tip of the scope.  Slight cobbling erythema oropharynx with nonvisualizable tonsils

## 2022-08-04 NOTE — Addendum Note (Signed)
Addended by: Lula Olszewski on: 08/04/2022 05:37 PM   Modules accepted: Orders

## 2022-08-04 NOTE — Patient Instructions (Signed)
Tennis Elbow  Tennis elbow (lateral epicondylitis) is inflammation of tendons in your outer forearm, near your elbow. Tendons are tissues that connect muscle to bone. When you have tennis elbow, inflammation affects the tendons that you use to bend your wrist and move your hand up. Inflammation occurs in the lower part of the upper arm bone (humerus), where the tendons connect to the bone (lateral epicondyle). Tennis elbow often affects people who play tennis, but anyone may get the condition from repeatedly extending the wrist or turning the forearm. What are the causes? This condition is usually caused by repeatedly extending the wrist, turning the forearm, and using the hands. It can result from sports or work that requires repetitive forearm movements. In some cases, it may be caused by a sudden injury. What increases the risk? You are more likely to develop tennis elbow if you play tennis or another racket sport. You also have a higher risk if you frequently use your hands for work. Besides people who play tennis, others at greater risk include: People who use computers. Construction workers. People who work in factories. Musicians. Cooks. Cashiers. What are the signs or symptoms? Symptoms of this condition include: Pain and tenderness in the forearm and the outer part of the elbow. Pain may be felt only when using the arm, or it may be there all the time. A burning feeling that starts in the elbow and spreads down the forearm. A weak grip in the hand. How is this diagnosed? This condition is diagnosed based on your symptoms, your medical history, and a physical exam. You may also have X-rays or an MRI to: Confirm the diagnosis. Look for other issues. Check for tears in the ligaments, muscles, or tendons. How is this treated? Resting and icing your arm is often the first treatment. Your health care provider may also recommend: Medicines to reduce pain and inflammation. These may be in  the form of a pill, topical gels, or shots of a steroid medicine (cortisone). An elbow strap to reduce stress on the area. Physical therapy. This may include massage or exercises or both. An elbow brace to restrict the movements that cause symptoms. If these treatments do not help relieve your symptoms, your health care provider may recommend surgery to remove damaged muscle and reattach healthy muscle to bone. Follow these instructions at home: If you have a brace or strap: Wear the brace or strap as told by your health care provider. Remove it only as told by your health care provider. Check the skin around the brace or strap every day. Tell your health care provider about any concerns. Loosen the brace if your fingers tingle, become numb, or turn cold and blue. Keep the brace clean. If the brace or strap is not waterproof: Do not let it get wet. Cover it with a watertight covering when you take a bath or a shower. Managing pain, stiffness, and swelling  If directed, put ice on the injured area. To do this: If you have a removable brace or strap, remove it as told by your health care provider. Put ice in a plastic bag. Place a towel between your skin and the bag. Leave the ice on for 20 minutes, 2-3 times a day. Remove the ice if your skin turns bright red. This is very important. If you cannot feel pain, heat, or cold, you have a greater risk of damage to the area. Move your fingers often to reduce stiffness and swelling. Activity Rest your elbow   and wrist and avoid activities that cause symptoms as told by your health care provider. Do physical therapy exercises as told by your health care provider. If you lift an object, lift it with your palm facing up. This reduces stress on your elbow. Lifestyle If your tennis elbow is caused by sports, check your equipment and make sure that: You use it correctly. It is good match for you. If your tennis elbow is caused by work or computer  use, take frequent breaks to stretch your arm. Talk with your employer about ways to manage your condition at work. General instructions Take over-the-counter and prescription medicines only as told by your health care provider. Do not use any products that contain nicotine or tobacco. These products include cigarettes, chewing tobacco, and vaping devices, such as e-cigarettes. If you need help quitting, ask your health care provider. Keep all follow-up visits. This is important. How is this prevented? Before and after activity: Warm up and stretch before being active. Cool down and stretch after being active. Give your body time to rest between periods of activity. During activity: Make sure to use equipment that fits you. If you play tennis, put power in your stroke with your lower body. Avoid using your arm only. Maintain physical fitness, including: Strength. Flexibility. Endurance. Do exercises to strengthen the forearm muscles. Contact a health care provider if: You have pain that gets worse or does not get better with treatment. You have numbness or weakness in your forearm, hand, or fingers. Get help right away if: Your pain is severe. You cannot move your wrist. Summary Tennis elbow (lateral epicondylitis) is inflammation of tendons in your outer forearm, near your elbow. Common symptoms include pain and tenderness in your forearm and the outer part of your elbow. This condition is usually caused by repeatedly extending your wrist, turning your forearm, and using your hands. The first treatment is often resting and icing your arm to relieve symptoms. Further treatment may include taking medicine, getting physical therapy, wearing a brace or strap, or having surgery. This information is not intended to replace advice given to you by your health care provider. Make sure you discuss any questions you have with your health care provider. Document Revised: 03/06/2020 Document  Reviewed: 03/06/2020 Elsevier Patient Education  2023 [employer].  

## 2022-08-04 NOTE — Assessment & Plan Note (Addendum)
Gave Augmentin after discussing risks and benefits and alternative and advising waiting until 7 days symptomatic  - though she does have purulent drainage now. Advised using nasal aerosol mist sterile saline to clear out sinuses of pollen and pathogens, both now and on an ongoing daily basis.

## 2022-08-04 NOTE — Assessment & Plan Note (Addendum)
We went over the conservative treatment recommendations: Activity modification (reduction or abstinence from inciting activities) Counterforce bracing (some patients prefer compression sleeve) Oral analgesics: acetaminophen, nonsteroidal antiinflammatory drugs (NSAIDs) if not contraindicated Physical therapy, including wrist mobility and eccentric strength exercises Ice applied to affected epicondyle area after provocative activity Advised to let us know if this doesn't resolve within a few months at which point we can set up sports medicine or imaging.

## 2022-11-07 ENCOUNTER — Other Ambulatory Visit: Payer: Self-pay | Admitting: Family Medicine

## 2022-11-07 DIAGNOSIS — Z1231 Encounter for screening mammogram for malignant neoplasm of breast: Secondary | ICD-10-CM

## 2022-11-17 ENCOUNTER — Ambulatory Visit
Admission: RE | Admit: 2022-11-17 | Discharge: 2022-11-17 | Disposition: A | Discharge status: Home / Self Care | Payer: Managed Care, Other (non HMO) | Source: Ambulatory Visit | Attending: Family Medicine | Admitting: Family Medicine

## 2022-11-17 DIAGNOSIS — Z1231 Encounter for screening mammogram for malignant neoplasm of breast: Secondary | ICD-10-CM

## 2022-12-18 ENCOUNTER — Ambulatory Visit (INDEPENDENT_AMBULATORY_CARE_PROVIDER_SITE_OTHER): Payer: Managed Care, Other (non HMO) | Admitting: Family Medicine

## 2022-12-18 ENCOUNTER — Encounter: Payer: Self-pay | Admitting: Family Medicine

## 2022-12-18 VITALS — BP 98/70 | HR 47 | Temp 98.3°F | Resp 98 | Ht 67.0 in | Wt 123.2 lb

## 2022-12-18 DIAGNOSIS — D6859 Other primary thrombophilia: Secondary | ICD-10-CM

## 2022-12-18 DIAGNOSIS — Z0000 Encounter for general adult medical examination without abnormal findings: Secondary | ICD-10-CM

## 2022-12-18 DIAGNOSIS — Z131 Encounter for screening for diabetes mellitus: Secondary | ICD-10-CM

## 2022-12-18 DIAGNOSIS — E785 Hyperlipidemia, unspecified: Secondary | ICD-10-CM

## 2022-12-18 DIAGNOSIS — R739 Hyperglycemia, unspecified: Secondary | ICD-10-CM | POA: Diagnosis not present

## 2022-12-18 MED ORDER — TRETINOIN 0.05 % EX GEL: 1.0000 "application " | Freq: Every day | CUTANEOUS | 3 refills | Status: DC

## 2022-12-18 NOTE — Progress Notes (Signed)
Phone [phone]   Subjective:  Patient presents today for their annual physical. Chief complaint-noted.   See problem oriented charting- ROS- full  review of systems was completed and negative except for: ear itching, rosacea  The following were reviewed and entered/updated in epic: Past Medical History:  Diagnosis Date   Allergy    Tamiflu , seasonal   Clotting disorder    protein S deficiency    History of D&C    miscarriage 2001   History of laparoscopy-assisted vaginal hysterectomy    Hx of LASIK    bilateral   Osteopenia    Varicose veins of both lower extremities    veins injected bilateral   Wisdom teeth extracted    Patient Active Problem List   Diagnosis Date Noted   Protein S deficiency 09/17/2018    Priority: Medium    Schatzki's ring of distal esophagus 03/19/2020    Priority: Low   Osteopenia 07/20/2019    Priority: Low   PARESTHESIA 12/28/2007    Priority: Low   Lateral epicondylitis of right elbow 08/04/2022   Chronic maxillary sinusitis 08/04/2022   Past Surgical History:  Procedure Laterality Date   COLONOSCOPY  07/2008   polyps, 09/2017   DILATION AND CURETTAGE OF UTERUS  1999   miscarriage   POLYPECTOMY     REFRACTIVE SURGERY Bilateral 2007   Lasik   UPPER GASTROINTESTINAL ENDOSCOPY  08/23/2019   dysphagia   URETHRAL STRICTURE DILATATION  1978   in high school     Family History  Problem Relation Age of Onset   Prostate cancer Father        robotic surgery and free 10 years in 2020   Colon polyps Father    Other Father        complications after covid0 over 78   Stomach cancer Maternal Uncle    Fibromyalgia Mother    Arthritis Mother    Hyperlipidemia Mother    Hypertension Mother        may be father though   Hemochromatosis Mother    Other Mother        over 110- died 8 months after husband passed   Healthy Sister    Non-Hodgkin's lymphoma Brother        doing well with treatment   Clotting disorder Maternal Grandfather     Deep vein thrombosis Brother        didnt dissolve- possible IVC filter   Glaucoma Paternal Grandmother    Colon cancer Neg Hx    Rectal cancer Neg Hx    Esophageal cancer Neg Hx     Medications- reviewed and updated Current Outpatient Medications  Medication Sig Dispense Refill   aspirin 81 MG tablet Take 81 mg by mouth daily.     Calcium Carb-Cholecalciferol (CALCIUM 1000 + D) 1000-800 MG-UNIT TABS calcium  1200 mg     cycloSPORINE (RESTASIS) 0.05 % ophthalmic emulsion Place 1 drop into both eyes 2 (two) times daily.     estradiol (VIVELLE-DOT) 0.1 MG/24HR patch Place 1 patch onto the skin 2 (two) times a week. [facility]     Multiple Vitamin (MULTIVITAMIN) tablet Take 1 tablet by mouth daily.     Probiotic Product (PHILLIPS COLON HEALTH PO) Take by mouth daily.     Psyllium (METAMUCIL FIBER PO) Take 1 each by mouth daily as needed. 1 tbs daily      cyclobenzaprine (FLEXERIL) 5 MG tablet TAKE 1 TABLET BY MOUTH EVERY 8 HOURS *DO  NOT DRIVE FOR 8 HOURS AFTER YOU TAKE MEDICINE* (Patient not taking: Reported on 12/18/2022) 21 tablet 0   tretinoin (ALTRALIN) 0.05 % gel Apply 1 application  topically at bedtime. 45 g 3   No current facility-administered medications for this visit.    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 [place]    Objective  Objective:  BP 98/70   Pulse (!) 47   Temp 98.3 F (36.8 C)   Resp (!) 98   Ht 5\' 7"  (1.702 m)   Wt 123 lb 3.2 oz (55.9 kg)   LMP  (LMP Unknown)   BMI 19.30 kg/m  Gen: NAD, resting comfortably HEENT: Mucous membranes are moist.  Oropharynx normal Neck: no thyromegaly CV: RRR no murmurs rubs or gallops Lungs: CTAB no crackles, wheeze, rhonchi Abdomen: soft/nontender/nondistended/normal bowel sounds. No rebound or guarding.  Ext: no edema Skin: warm, dry Neuro: grossly normal, moves all extremities, PERRLA   Assessment and Plan   67 y.o. female presenting for annual physical.  Health Maintenance counseling: 1. Anticipatory guidance: Patient counseled regarding regular dental exams -q6 months, eye exams -yearly,  avoiding smoking and second hand smoke , limiting alcohol to 1 beverage per day, no illicit drugs .   2. Risk factor reduction:  Advised patient of need for regular exercise and diet rich and fruits and vegetables to reduce risk of heart attack and stroke.  Exercise- regular exercise- lifting weights, rowing, aerobics, stretching, yoga- about an hour a day 5 days a week- plus walks 5 miles twice a week.  Diet/weight management-weight largely stable- would avoid any weight loss.  Wt Readings from Last 3 Encounters:  12/18/22 123 lb 3.2 oz (55.9 kg)  08/04/22 125 lb (56.7 kg)  12/12/21 125 lb 6.4 oz (56.9 kg)  3. Immunizations/screenings/ancillary studies- opts out of COVID repeat- recommended in the fall Immunization History  Administered Date(s) Administered   Influenza Inj Mdck Quad Pf 05/27/2019   Influenza,inj,Quad PF,6+ Mos 06/11/2018, 06/03/2019, 07/15/2021, 07/18/2022   Moderna Covid-19 Vaccine Bivalent Booster 36yrs & up 07/18/2022   Moderna Sars-Covid-2 Vaccination 06/03/2021   PFIZER(Purple Top)SARS-COV-2 Vaccination 11/18/2019, 12/19/2019, 06/19/2020   Tdap 06/24/2017   Zoster Recombinat (Shingrix) 09/21/2018, 01/11/2019  4. Cervical cancer screening- pap smear 11/08/19 with 3 year repeat planned-  scheduled in may- asked her to have them send [place] a copy 5. Breast cancer screening-  breast exam with GYN and 3 days mammogram 11/17/22 with 1 year repeat planned 6. Colon cancer screening -09/29/18  with 10 year repeat planned   7. Skin cancer screening-   with Dr. Roderic Scarce  q3 years. advised regular sunscreen use. Denies worrisome, changing, or new skin lesions.  8. Birth control/STD check- postmenopausal and monogamous  9. Osteoporosis screening at 71- DEXA 08/06/2021 with 2 year repeat planned with osteopenia - not yet due- she will ask for [facility] December -never smoker   Status of chronic or acute concerns   #08/23/2019- Schatzki's ring dilation- she has to chew food slowly or can have issues- discussed could have her  see gastroenterology- she wants to hold for now and chew slowly!  # Protein S deficiency S: No history of DVT or PE.  Remains on aspirin.no edema or shortness of breath  - Last year we discussed being on estradiol can increase clotting risk  A/P: thankfully no recent issues even with estradiol- will monitor   #hyperlipidemia S: Medication:none  Lab Results  Component Value Date   CHOL 181 12/11/2021   HDL 73.40 12/11/2021   LDLCALC 93 12/11/2021   TRIG 71.0 12/11/2021   CHOLHDL 2 12/11/2021   A/P:  The 10-year ASCVD risk score (Arnett DK, et al., 2019) is: 2.5%- we will recalculate and consider CT calcium scoring- would start with this before statin even if risk gets above 5%    # Hyperglycemia/insulin resistance/prediabetes S:  Medication: none Lab Results  Component Value Date   HGBA1C 5.4 12/11/2021   HGBA1C 5.3 12/10/2020   HGBA1C 5.5 10/20/2019   A/P: a1c has been well controlled even if sugars high in past- update with labs today  # Left ear itching- ongoing issues with this- last several years. She also has noted some trouble hearing- wants to get audiology evaluation. She is going to get tested at [facility].   #Snoring - husband noted some snoring and pauses in breathing a few months ago but that has resolved    #Rosacea/facial bumps- [facility] and was given tretinoin- needs refill  #Upper neck tension- gave name  for Dr. Katrinka BlazingSmith of [place] [facility] specialist(s)   #bradycardia- her baseline- athletic heart rate 48 on my check- no chest pain, shortness of breath, lightheadedness- tolerates lower blood pressure as well   Recommended follow up: Return in about 1 year (around 12/18/2023) for physical or sooner if needed.Schedule b4 you leave.  Lab/Order associations: NOT fasting- chia pudding for breakfast and oatmeal for lunch with dried fruit   ICD-10-CM   1. Preventative health care  Z00.00 CBC with Differential/Platelet    Comprehensive metabolic panel    Lipid panel    HgB A1c    2. Mild hyperlipidemia  E78.5 CBC with Differential/Platelet    Comprehensive metabolic panel    Lipid panel    3. Screening for diabetes mellitus  Z13.1 HgB A1c    4. Hyperglycemia  R73.9 HgB A1c    5. Protein S deficiency Chronic D68.59      Meds ordered this encounter  Medications   tretinoin (ALTRALIN) 0.05 % gel    Sig: Apply 1 application  topically at bedtime.    Dispense:  45 g    Refill:  3   Return precautions advised.  Tana ConchStephen Latessa Tillis, MD

## 2022-12-18 NOTE — Patient Instructions (Addendum)
Sign release of information at the check out desk for pap smear  Get hearing evaluated at [facility]- my understanding is that the testing is free  Please stop by lab before you go If you have mychart- we will send your results within 3 business days of [place] receiving them.  If you do not have mychart- we will call you about results within 5 business days of [place] receiving them.  *please also note that you will see labs on mychart as soon as they post. I will later go in and write notes on them- will say "notes from Dr. Durene Cal"

## 2022-12-19 LAB — CBC WITH DIFFERENTIAL/PLATELET
Basophils Absolute: 0.1 10*3/uL (ref 0.0–0.1)
Basophils Relative: 0.9 % (ref 0.0–3.0)
Eosinophils Absolute: 0.2 10*3/uL (ref 0.0–0.7)
Eosinophils Relative: 2.8 % (ref 0.0–5.0)
HCT: 41.9 % (ref 36.0–46.0)
Hemoglobin: 13.9 g/dL (ref 12.0–15.0)
Lymphocytes Relative: 26.8 % (ref 12.0–46.0)
Lymphs Abs: 1.7 10*3/uL (ref 0.7–4.0)
MCHC: 33.2 g/dL (ref 30.0–36.0)
MCV: 94 fl (ref 78.0–100.0)
Monocytes Absolute: 0.3 10*3/uL (ref 0.1–1.0)
Monocytes Relative: 5.3 % (ref 3.0–12.0)
Neutro Abs: 4.1 10*3/uL (ref 1.4–7.7)
Neutrophils Relative %: 64.2 % (ref 43.0–77.0)
Platelets: 172 10*3/uL (ref 150.0–400.0)
RBC: 4.46 Mil/uL (ref 3.87–5.11)
RDW: 13.8 % (ref 11.5–15.5)
WBC: 6.4 10*3/uL (ref 4.0–10.5)

## 2022-12-19 LAB — COMPREHENSIVE METABOLIC PANEL
ALT: 16 U/L (ref 0–35)
AST: 23 U/L (ref 0–37)
Albumin: 4.5 g/dL (ref 3.5–5.2)
Alkaline Phosphatase: 45 U/L (ref 39–117)
BUN: 16 mg/dL (ref 6–23)
CO2: 30 mEq/L (ref 19–32)
Calcium: 9.6 mg/dL (ref 8.4–10.5)
Chloride: 101 mEq/L (ref 96–112)
Creatinine, Ser: 0.85 mg/dL (ref 0.40–1.20)
GFR: 72.42 mL/min (ref >60.00)
Glucose, Bld: 81 mg/dL (ref 70–99)
Potassium: 4 mEq/L (ref 3.5–5.1)
Sodium: 138 mEq/L (ref 135–145)
Total Bilirubin: 0.8 mg/dL (ref 0.2–1.2)
Total Protein: 6.6 g/dL (ref 6.0–8.3)

## 2022-12-19 LAB — LIPID PANEL
Cholesterol: 187 mg/dL (ref 0–200)
HDL: 82.7 mg/dL (ref >39.00)
LDL Cholesterol: 94 mg/dL (ref 0–99)
NonHDL: 104.57
Total CHOL/HDL Ratio: 2
Triglycerides: 51 mg/dL (ref 0.0–149.0)
VLDL: 10.2 mg/dL (ref 0.0–40.0)

## 2022-12-19 LAB — HEMOGLOBIN A1C: Hgb A1c MFr Bld: 5.2 % (ref 4.6–6.5)

## 2023-08-17 LAB — HM DEXA SCAN

## 2023-09-19 IMAGING — MG MM DIGITAL SCREENING BILAT W/ TOMO AND CAD
8 series · 9 of 24 positions shown · non-contrast
Comparison: Previous exam(s).

CLINICAL DATA: Screening.

EXAM:
DIGITAL SCREENING BILATERAL MAMMOGRAM WITH TOMOSYNTHESIS AND CAD
TECHNIQUE: Bilateral screening digital craniocaudal and mediolateral oblique
mammograms were obtained. Bilateral screening digital breast
tomosynthesis was performed. The images were evaluated with
computer-aided detection.

[L CC synth-2D]
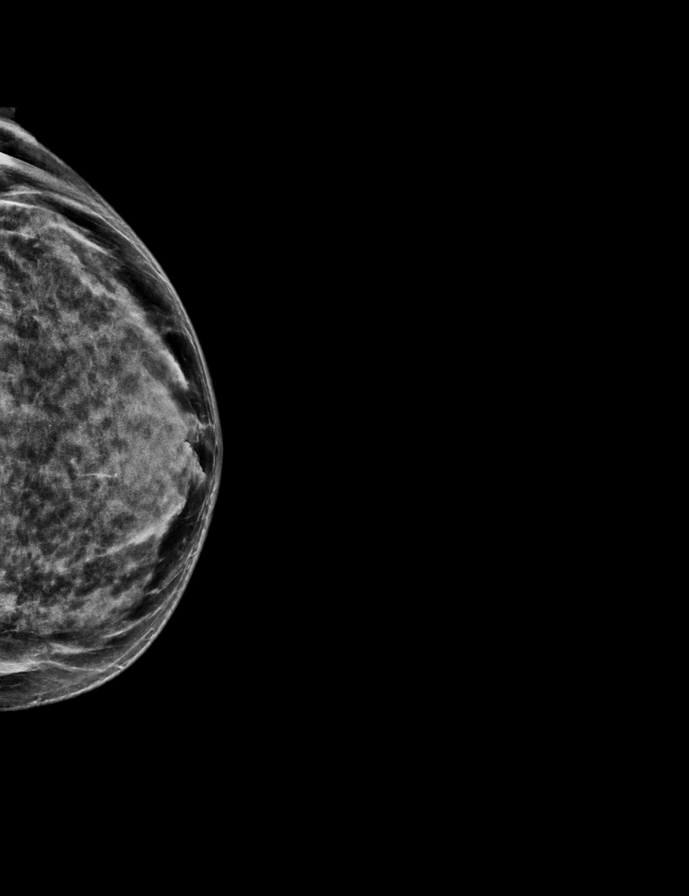

[L MLO synth-2D]
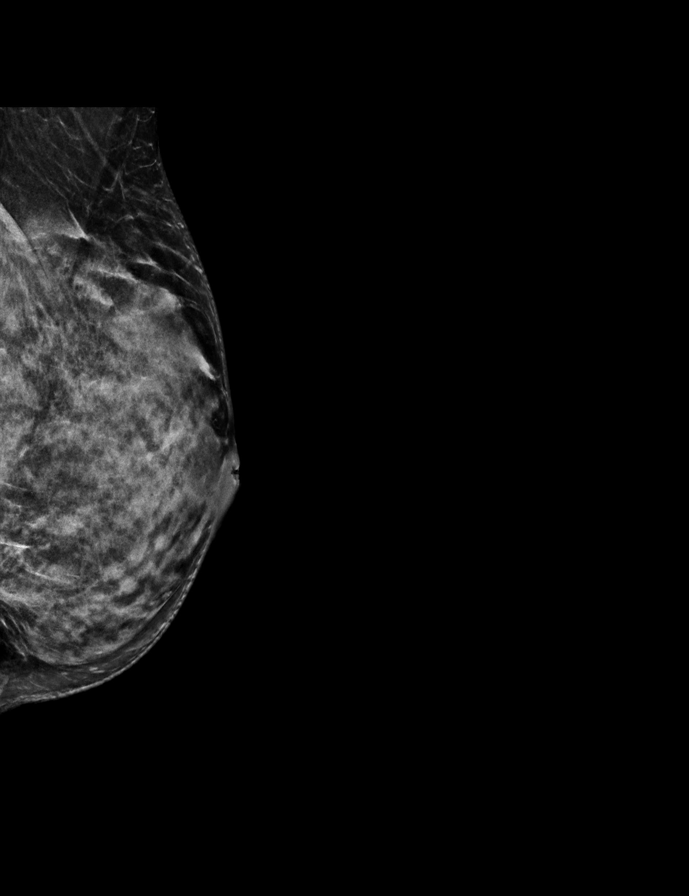

[R MLO synth-2D]
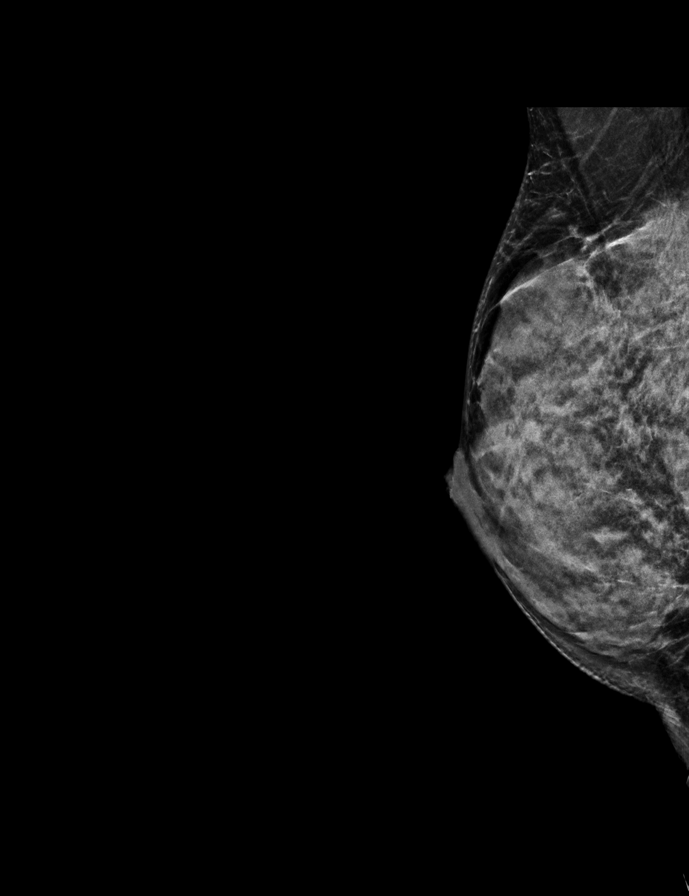

[R CC synth-2D]
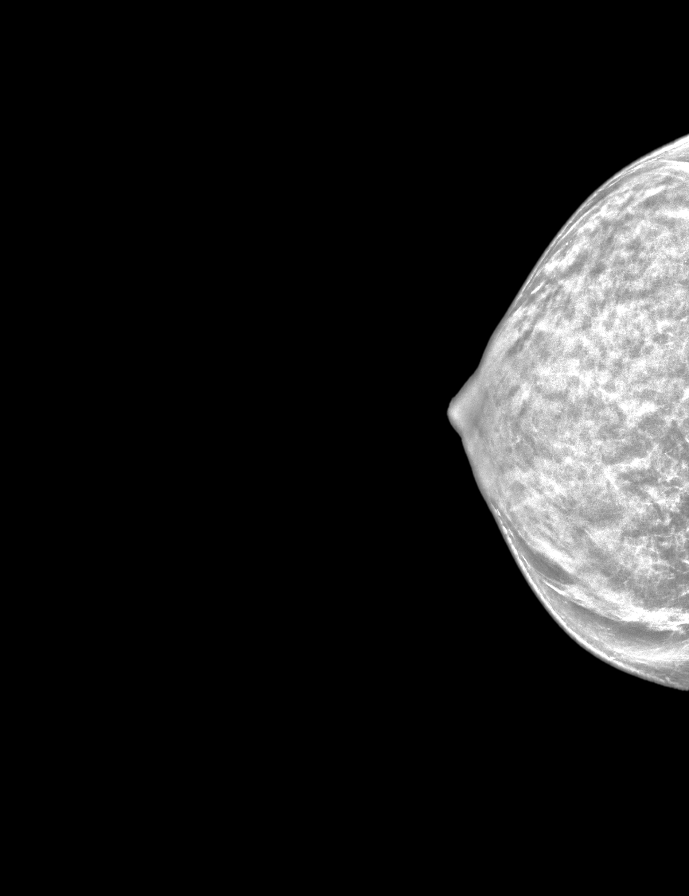

[R MLO tomo · 2 of 49 frames shown]
[frame 16/49]
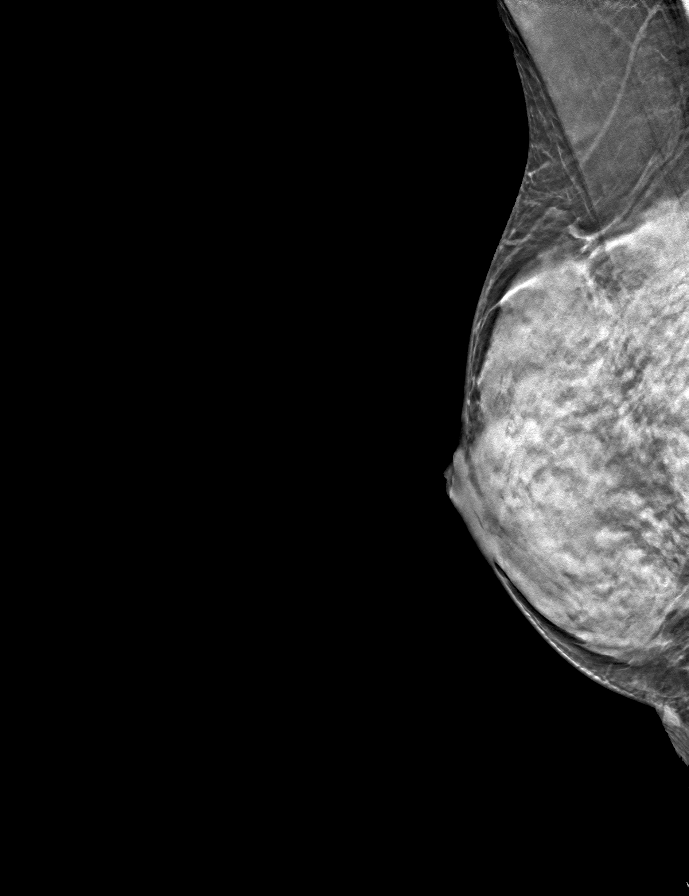
[frame 25/49]
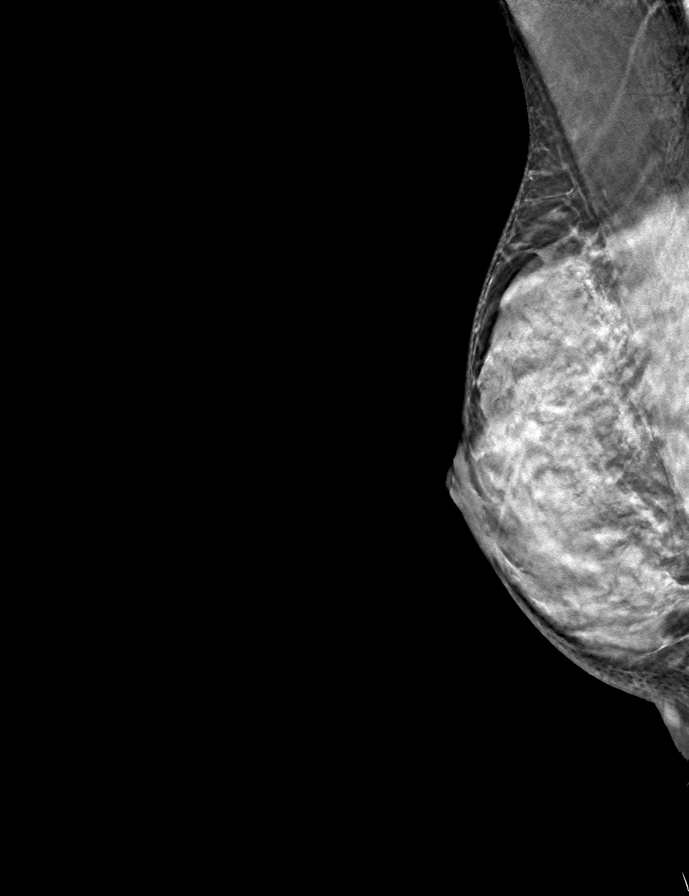

[L CC tomo · tomo slice 25/50.0]
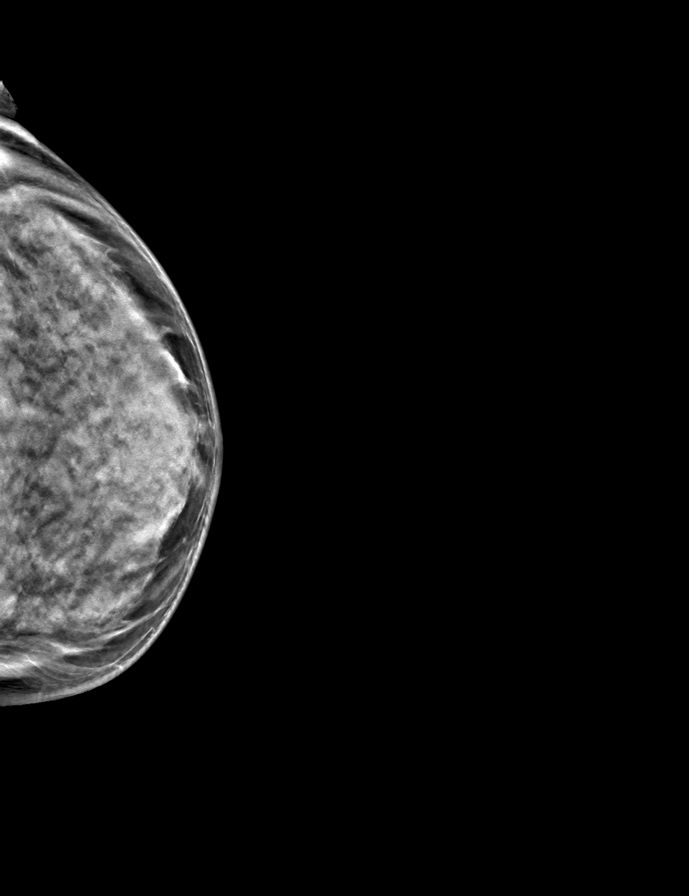

[L MLO tomo · tomo slice 25/50.0]
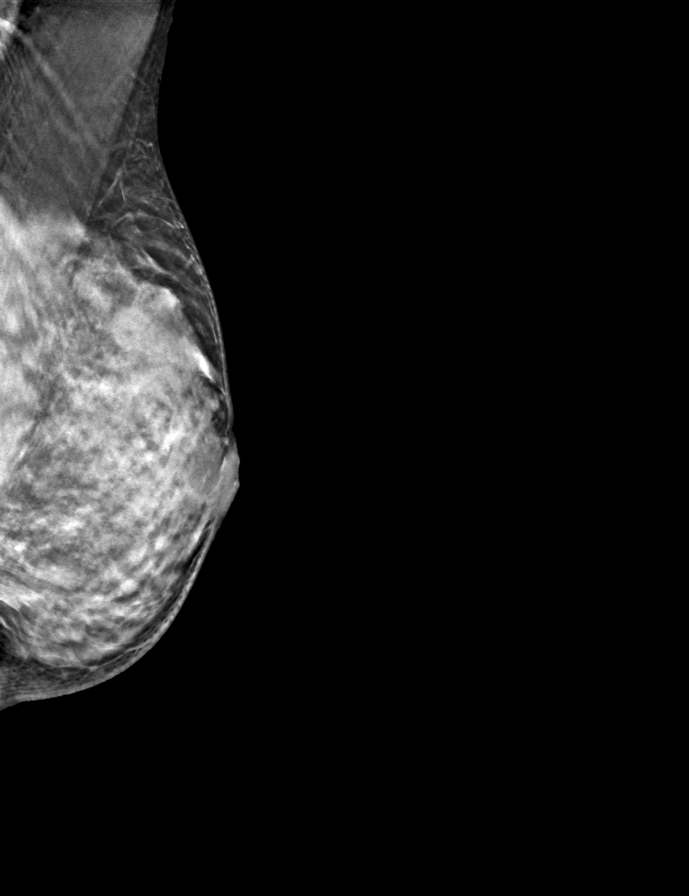

[R CC tomo · tomo slice 25/48.0]
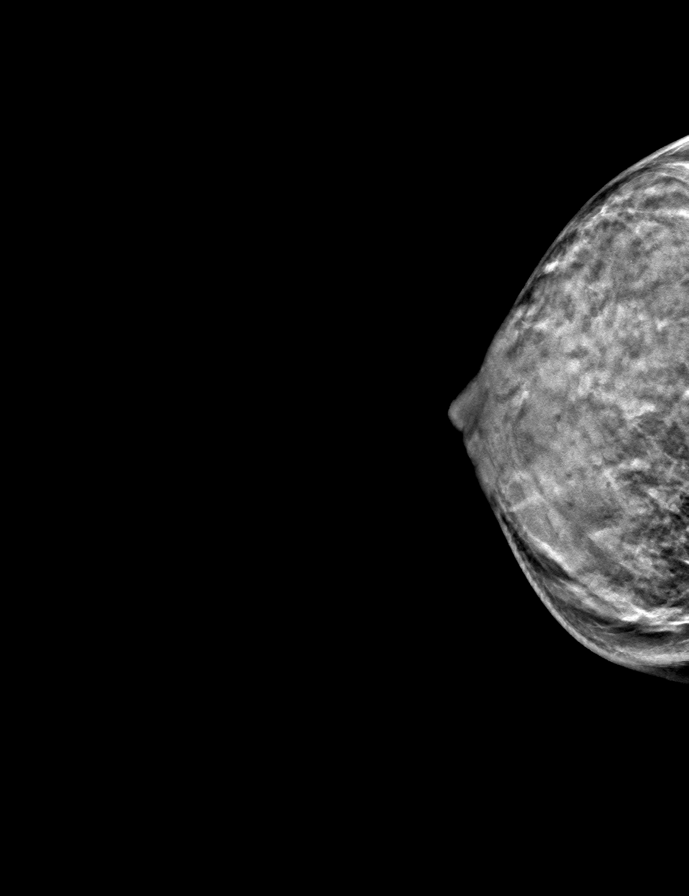

[9 of 24 positions shown; findings below may reference images not displayed]

ACR Breast Density Category d: The breast tissue is extremely dense,
which lowers the sensitivity of mammography
FINDINGS: There are no findings suspicious for malignancy.
IMPRESSION: No mammographic evidence of malignancy. A result letter of this
screening mammogram will be mailed directly to the patient.

RECOMMENDATION:
Screening mammogram in one year. (Code:[id number])

BI-RADS CATEGORY  1: Negative.

## 2023-10-06 ENCOUNTER — Ambulatory Visit: Payer: BC Managed Care – PPO | Admitting: Family Medicine

## 2023-10-06 ENCOUNTER — Encounter: Payer: Self-pay | Admitting: Family Medicine

## 2023-10-06 ENCOUNTER — Other Ambulatory Visit: Payer: Self-pay | Admitting: Family Medicine

## 2023-10-06 VITALS — BP 102/60 | HR 66 | Temp 97.7°F | Ht 67.0 in | Wt 126.6 lb

## 2023-10-06 DIAGNOSIS — R0683 Snoring: Secondary | ICD-10-CM | POA: Diagnosis not present

## 2023-10-06 DIAGNOSIS — Z1231 Encounter for screening mammogram for malignant neoplasm of breast: Secondary | ICD-10-CM

## 2023-10-06 NOTE — Progress Notes (Signed)
  Phone [phone] In person visit   Subjective:   Stacy Baker is a 68 y.o. year old very pleasant female patient who presents for/with See problem oriented charting Chief Complaint  Patient presents with   Snoring    Pt c/o husband waking her up while sleeping due to him thinking she is not breathing and snoring.   Past Medical History-  Patient Active Problem List   Diagnosis Date Noted   Protein S deficiency (HCC) 09/17/2018    Priority: Medium    Schatzki's ring of distal esophagus 03/19/2020    Priority: Low   Osteopenia 07/20/2019    Priority: Low   PARESTHESIA 12/28/2007    Priority: Low   Lateral epicondylitis of right elbow 08/04/2022   Chronic maxillary sinusitis 08/04/2022    Medications- reviewed and updated Current Outpatient Medications  Medication Sig Dispense Refill   aspirin 81 MG tablet Take 81 mg by mouth daily.     Calcium Carb-Cholecalciferol (CALCIUM 1000 + D) 1000-800 MG-UNIT TABS calcium  1200 mg     cycloSPORINE (RESTASIS) 0.05 % ophthalmic emulsion Place 1 drop into both eyes 2 (two) times daily.     estradiol (VIVELLE-DOT) 0.1 MG/24HR patch Place 1 patch onto the skin 2 (two) times a week. [facility]     Multiple Vitamin (MULTIVITAMIN) tablet Take 1 tablet by mouth daily.     Probiotic Product (PHILLIPS COLON HEALTH PO) Take by mouth daily.     Psyllium (METAMUCIL FIBER PO) Take 1 each by mouth daily as needed. 1 tbs daily      tretinoin (ALTRALIN) 0.05 % gel Apply 1 application  topically at bedtime. 45 g 3   cyclobenzaprine (FLEXERIL) 5 MG tablet TAKE 1 TABLET BY MOUTH EVERY 8 HOURS *DO NOT DRIVE FOR 8 HOURS AFTER YOU TAKE MEDICINE* (Patient not taking: Reported on 10/06/2023) 21 tablet 0   No current facility-administered medications for this visit.     Objective:  BP 102/60   Pulse 66   Temp 97.7 F (36.5 C)   Ht 5\' 7"  (1.702 m)   Wt 126 lb 9.6 oz (57.4 kg)   LMP  (LMP Unknown)   SpO2 100%   BMI  19.83 kg/m  Gen: NAD, resting comfortably CV: RRR no murmurs rubs or gallops Lungs: CTAB no crackles, wheeze, rhonchi Ext: no edema Skin: warm, dry     Assessment and Plan   # Social update-fully retired now and daughter doing well is a Holiday representative in high school going to [employer] next year!  # pauses in breathing/snoring S:patients husband has noted pauses in breathing and snoring at night. He has had to wake her up at times due to duration of snoring - he has noted it for at least a year.  - no daytime somnolence and cannot nap typically A/P: STOP-BANG score of 3 indicating intermediate risk of sleep apnea.  We discussed referral to pulmonology for further evaluation and she is in agreement-we discussed possible home sleep study if covered by insurance which would be her preference  Recommended follow up: Return for next already scheduled visit or sooner if needed. Future Appointments  Date Time Provider Department Center  11/19/2023  2:50 PM [facility] MM 2 [facility]  12/21/2023  2:00 PM Shelva Majestic, MD [facility]   Lab/Order associations:   ICD-10-CM   1. Snoring  R06.83 Ambulatory referral to Pulmonology      Return precautions advised.  Tana Conch, MD

## 2023-10-06 NOTE — Patient Instructions (Addendum)
Consider Prevnar 20 pneumonia shot for next visit  Call # listed below for [place] [facility] if you don't hear within a week  Recommended follow up: Return for next already scheduled visit or sooner if needed.

## 2023-11-19 ENCOUNTER — Ambulatory Visit
Admission: RE | Admit: 2023-11-19 | Discharge: 2023-11-19 | Disposition: A | Discharge status: Home / Self Care | Payer: BC Managed Care – PPO | Source: Ambulatory Visit | Attending: Family Medicine | Admitting: Family Medicine

## 2023-11-19 DIAGNOSIS — Z1231 Encounter for screening mammogram for malignant neoplasm of breast: Secondary | ICD-10-CM

## 2023-12-03 ENCOUNTER — Encounter (HOSPITAL_BASED_OUTPATIENT_CLINIC_OR_DEPARTMENT_OTHER): Payer: Self-pay | Admitting: Adult Health

## 2023-12-03 ENCOUNTER — Ambulatory Visit (INDEPENDENT_AMBULATORY_CARE_PROVIDER_SITE_OTHER): Payer: BC Managed Care – PPO | Admitting: Adult Health

## 2023-12-03 VITALS — BP 108/62 | HR 52 | Ht 68.0 in | Wt 125.4 lb

## 2023-12-03 DIAGNOSIS — R0683 Snoring: Secondary | ICD-10-CM | POA: Diagnosis not present

## 2023-12-03 NOTE — Patient Instructions (Signed)
 Set up for home sleep study  Follow up in 6 weeks to discuss results and treatment plan -Virtual or In person

## 2023-12-03 NOTE — Progress Notes (Signed)
 Epworth Sleepiness Scale  Use the following scale to choose the most appropriate number for each situation. 0 Would never nod off 1  Slight  chance of nodding off 2 Moderate chance of nodding off 3 High chance of nodding off  Sitting and reading: 0 Watching TV: 1 Sitting, inactive, in a public place (e.g., in a meeting, theater, or dinner event): 0 As a passenger in a car for an hour or more without stopping for a break: 1 Lying down to rest when circumstances permit:1 Sitting and talking to someone: 0 Sitting quietly after a meal without alcohol: 0 In a car, while stopped for a few  minutes in traffic or at a light: 0  TOTOAL: 3

## 2023-12-03 NOTE — Assessment & Plan Note (Signed)
 Snoring, witnessed apneic events and gasping for air during sleep, teeth clenching all suspicious for underlying sleep apnea. Set patient up for home sleep study.  Patient education given on sleep apnea.  - discussed how weight can impact sleep and risk for sleep disordered breathing - discussed options to assist with weight loss: combination of diet modification, cardiovascular and strength training exercises   - had an extensive discussion regarding the adverse health consequences related to untreated sleep disordered breathing - specifically discussed the risks for hypertension, coronary artery disease, cardiac dysrhythmias, cerebrovascular disease, and diabetes - lifestyle modification discussed   - discussed how sleep disruption can increase risk of accidents, particularly when driving - safe driving practices were discussed   Plan  Patient Instructions  Set up for home sleep study  Follow up in 6 weeks to discuss results and treatment plan -Virtual or In person  n

## 2023-12-03 NOTE — Progress Notes (Signed)
 @Patient  ID: Stacy Baker, female    DOB: November 12, 1955, 68 y.o.   MRN: [id number]  Chief Complaint  Patient presents with   Consult    Referring provider: Shelva Majestic, MD  HPI: 68 yo female seen for sleep consult for snoring  TEST/EVENTS :   12/03/2023 Follow up: Snoring  Patient presents for a sleep consult for loud snoring and witnessed apneic events.  Kindly referred by primary care provider Dr. Durene Cal.  Patient says that her spouse has noticed that she is having chronic snoring that seems to be getting worse over the last year she has episodes where she stops breathing and gasp for air with mild associated snoring.  Typically goes to bed about 10 PM.  Takes only a few minutes to go to sleep.  She says that she sleeps very hard and soundly.  Gets up about 6:30 AM.  Says that she wakes up feeling pretty rested.  She has no significant daytime sleepiness.  She says she has snored most of her life.  She does not nap.  Takes in about 2 cups of coffee daily.  Does not take any sleep aids.  Has no history of congestive heart failure or stroke.  She does have a brother who has sleep apnea is on a CPAP machine.  She has no removable dental work.  She has no symptoms suspicious for cataplexy or sleep paralysis.  She does have teeth clenching and wears a night guard at nighttime.  Epworth score is 3 out of 24.  Typically gets sleepy if she sits down to watch TV and in the passenger of a car.  Social history patient is married.  She lives at home with her husband and daughter.  She is retired from Presenter, broadcasting.  She is a never smoker.  Social alcohol.  Family history positive for allergies and blood clots.  Medical history significant for chronic allergies.  Protein S deficiency strong family history of VTE.  Past Surgical History:  Procedure Laterality Date   COLONOSCOPY  07/2008   polyps, 09/2017   DILATION AND CURETTAGE OF UTERUS  1999   miscarriage   POLYPECTOMY      REFRACTIVE SURGERY Bilateral 2007   Lasik   UPPER GASTROINTESTINAL ENDOSCOPY  08/23/2019   dysphagia   URETHRAL STRICTURE DILATATION  1978   in high school      Allergies  Allergen Reactions   Tamiflu [Oseltamivir Phosphate] Hives and Itching    Immunization History  Administered Date(s) Administered   Influenza Inj Mdck Quad Pf 05/27/2019   Influenza, High Dose Seasonal PF 07/24/2023   Influenza,inj,Quad PF,6+ Mos 06/11/2018, 06/03/2019, 07/15/2021, 07/18/2022   Moderna Covid-19 Vaccine Bivalent Booster 57yrs & up 07/18/2022   Moderna Sars-Covid-2 Vaccination 06/03/2021   PFIZER(Purple Top)SARS-COV-2 Vaccination 11/18/2019, 12/19/2019, 06/19/2020   Pfizer(Comirnaty)Fall Seasonal Vaccine 12 years and older 07/24/2023   Tdap 06/24/2017   Zoster Recombinant(Shingrix) 09/21/2018, 01/11/2019    Past Medical History:  Diagnosis Date   Allergy    Tamiflu , seasonal   Clotting disorder (HCC)    protein S deficiency    History of D&C    miscarriage 2001   History of laparoscopy-assisted vaginal hysterectomy    Hx of LASIK    bilateral   Osteopenia    Varicose veins of both lower extremities    veins injected bilateral   Wisdom teeth extracted     Tobacco History: Social History   Tobacco Use  Smoking Status Never  Smokeless Tobacco  Never   Counseling given: Not Answered   Outpatient Medications Prior to Visit  Medication Sig Dispense Refill   aspirin 81 MG tablet Take 81 mg by mouth daily.     Calcium Carb-Cholecalciferol (CALCIUM 1000 + D) 1000-800 MG-UNIT TABS calcium  1200 mg     cyclobenzaprine (FLEXERIL) 5 MG tablet TAKE 1 TABLET BY MOUTH EVERY 8 HOURS *DO NOT DRIVE FOR 8 HOURS AFTER YOU TAKE MEDICINE* 21 tablet 0   cycloSPORINE (RESTASIS) 0.05 % ophthalmic emulsion Place 1 drop into both eyes 2 (two) times daily.     estradiol (VIVELLE-DOT) 0.1 MG/24HR patch Place 1 patch onto the skin 2 (two) times a week. [facility]     Multiple  Vitamin (MULTIVITAMIN) tablet Take 1 tablet by mouth daily.     Probiotic Product (PHILLIPS COLON HEALTH PO) Take by mouth daily.     Psyllium (METAMUCIL FIBER PO) Take 1 each by mouth daily as needed. 1 tbs daily      tretinoin (ALTRALIN) 0.05 % gel Apply 1 application  topically at bedtime. 45 g 3   No facility-administered medications prior to visit.     Review of Systems:   Constitutional:   No  weight loss, night sweats,  Fevers, chills, fatigue, or  lassitude.  HEENT:   No headaches,  Difficulty swallowing,  Tooth/dental problems, or  Sore throat,                No sneezing, itching, ear ache, nasal congestion, post nasal drip,   CV:  No chest pain,  Orthopnea, PND, swelling in lower extremities, anasarca, dizziness, palpitations, syncope.   GI  No heartburn, indigestion, abdominal pain, nausea, vomiting, diarrhea, change in bowel habits, loss of appetite, bloody stools.   Resp: No shortness of breath with exertion or at rest.  No excess mucus, no productive cough,  No non-productive cough,  No coughing up of blood.  No change in color of mucus.  No wheezing.  No chest wall deformity  Skin: no rash or lesions.  GU: no dysuria, change in color of urine, no urgency or frequency.  No flank pain, no hematuria   MS:  No joint pain or swelling.  No decreased range of motion.  No back pain.    Physical Exam  BP 108/62   Pulse (!) 52   Ht 5\' 8"  (1.727 m)   Wt 125 lb 6.4 oz (56.9 kg)   LMP  (LMP Unknown)   SpO2 99%   BMI 19.07 kg/m   GEN: A/Ox3; pleasant , NAD, well nourished    HEENT:  /AT,   NOSE-clear, THROAT-clear, no lesions, no postnasal drip or exudate noted.  Class II MP airway  NECK:  Supple w/ fair ROM; no JVD; normal carotid impulses w/o bruits; no thyromegaly or nodules palpated; no lymphadenopathy.    RESP  Clear  P & A; w/o, wheezes/ rales/ or rhonchi. no accessory muscle use, no dullness to percussion  CARD:  RRR, no m/r/g, no peripheral edema, pulses  intact, no cyanosis or clubbing.  GI:   Soft & nt; nml bowel sounds; no organomegaly or masses detected.   Musco: Warm bil, no deformities or joint swelling noted.   Neuro: alert, no focal deficits noted.    Skin: Warm, no lesions or rashes    Lab Results:  CBC    Component Value Date/Time   WBC 6.4 12/18/2022 1558   RBC 4.46 12/18/2022 1558   HGB 13.9 12/18/2022 1558  HCT 41.9 12/18/2022 1558   PLT 172.0 12/18/2022 1558   MCV 94.0 12/18/2022 1558   MCHC 33.2 12/18/2022 1558   RDW 13.8 12/18/2022 1558   LYMPHSABS 1.7 12/18/2022 1558   MONOABS 0.3 12/18/2022 1558   EOSABS 0.2 12/18/2022 1558   BASOSABS 0.1 12/18/2022 1558    BMET    Component Value Date/Time   NA 138 12/18/2022 1558   K 4.0 12/18/2022 1558   CL 101 12/18/2022 1558   CO2 30 12/18/2022 1558   GLUCOSE 81 12/18/2022 1558   BUN 16 12/18/2022 1558   CREATININE 0.85 12/18/2022 1558   CALCIUM 9.6 12/18/2022 1558    BNP No results found for: "BNP"  ProBNP No results found for: "PROBNP"   Administration History     None           No data to display          No results found for: "NITRICOXIDE"      Assessment & Plan:   Snoring Snoring, witnessed apneic events and gasping for air during sleep, teeth clenching all suspicious for underlying sleep apnea. Set patient up for home sleep study.  Patient education given on sleep apnea.  - discussed how weight can impact sleep and risk for sleep disordered breathing - discussed options to assist with weight loss: combination of diet modification, cardiovascular and strength training exercises   - had an extensive discussion regarding the adverse health consequences related to untreated sleep disordered breathing - specifically discussed the risks for hypertension, coronary artery disease, cardiac dysrhythmias, cerebrovascular disease, and diabetes - lifestyle modification discussed   - discussed how sleep disruption can increase risk of  accidents, particularly when driving - safe driving practices were discussed   Plan  Patient Instructions  Set up for home sleep study  Follow up in 6 weeks to discuss results and treatment plan -Virtual or In person  n     Rubye Oaks, NP 12/03/2023

## 2023-12-16 ENCOUNTER — Encounter

## 2023-12-16 DIAGNOSIS — R0683 Snoring: Secondary | ICD-10-CM

## 2023-12-21 ENCOUNTER — Encounter: Payer: Managed Care, Other (non HMO) | Admitting: Family Medicine

## 2023-12-30 DIAGNOSIS — Z0389 Encounter for observation for other suspected diseases and conditions ruled out: Secondary | ICD-10-CM | POA: Diagnosis not present

## 2024-01-18 ENCOUNTER — Encounter (HOSPITAL_BASED_OUTPATIENT_CLINIC_OR_DEPARTMENT_OTHER): Payer: Self-pay | Admitting: Adult Health

## 2024-01-18 ENCOUNTER — Ambulatory Visit (HOSPITAL_BASED_OUTPATIENT_CLINIC_OR_DEPARTMENT_OTHER): Admitting: Adult Health

## 2024-01-18 VITALS — BP 97/49 | HR 55 | Ht 68.0 in | Wt 123.8 lb

## 2024-01-18 DIAGNOSIS — R0683 Snoring: Secondary | ICD-10-CM | POA: Diagnosis not present

## 2024-01-18 MED ORDER — AZITHROMYCIN 250 MG PO TABS: ORAL_TABLET | ORAL | 0 refills | Status: DC

## 2024-01-18 NOTE — Patient Instructions (Addendum)
 Work on healthy sleep regimen  Sleep with head of bed at incline -30 degrees  Avoid sleeping on your back  Contact Dr. Ardell Koller if interested in the oral appliance for snoring [phone] Follow up in 6 weeks to discuss results and treatment plan -Virtual or In person

## 2024-01-18 NOTE — Progress Notes (Signed)
 @Patient  ID: Arlina Benjamin, female    DOB: May 28, 1956, 68 y.o.   MRN: [id number]  Chief Complaint  Patient presents with   Follow-up    Referring provider: Almira Jaeger, MD  HPI: 68 year old female seen for sleep consult December 03, 2023 for snoring  TEST/EVENTS :   01/18/2024 Follow up : OSA  Patient presents for a 83-month follow-up.  Patient was seen last visit for sleep consult.  She was having chronic snoring and witnessed episodes of apnea.  She was set up for home sleep study that was done on December 16, 2023 that showed no significant sleep apnea.  AHI was 2.5/hour minimum SpO2 was 88% with a baseline at 99%.  We discussed her sleep study results in detail.  Patient is very relieved.  Said that she does not feel like that her sleep is not disrupted.  She feels pretty rested.  She does use a nightguard for teeth grinding.   Allergies  Allergen Reactions   Tamiflu [Oseltamivir Phosphate] Hives and Itching    Immunization History  Administered Date(s) Administered   Influenza Inj Mdck Quad Pf 05/27/2019   Influenza, High Dose Seasonal PF 07/24/2023   Influenza,inj,Quad PF,6+ Mos 06/11/2018, 06/03/2019, 07/15/2021, 07/18/2022   Moderna Covid-19 Vaccine Bivalent Booster 81yrs & up 07/18/2022   Moderna Sars-Covid-2 Vaccination 06/03/2021   PFIZER(Purple Top)SARS-COV-2 Vaccination 11/18/2019, 12/19/2019, 06/19/2020   Pfizer(Comirnaty)Fall Seasonal Vaccine 12 years and older 07/24/2023   Tdap 06/24/2017   Zoster Recombinant(Shingrix ) 09/21/2018, 01/11/2019    Past Medical History:  Diagnosis Date   Allergy    Tamiflu , seasonal   Clotting disorder (HCC)    protein S deficiency    History of D&C    miscarriage 2001   History of laparoscopy-assisted vaginal hysterectomy    Hx of LASIK    bilateral   Osteopenia    Varicose veins of both lower extremities    veins injected bilateral   Wisdom teeth extracted     Tobacco History: Social History   Tobacco  Use  Smoking Status Never  Smokeless Tobacco Never   Counseling given: Not Answered   Outpatient Medications Prior to Visit  Medication Sig Dispense Refill   aspirin 81 MG tablet Take 81 mg by mouth daily.     Calcium Carb-Cholecalciferol (CALCIUM 1000 + D) 1000-800 MG-UNIT TABS calcium  1200 mg     cyclobenzaprine  (FLEXERIL ) 5 MG tablet TAKE 1 TABLET BY MOUTH EVERY 8 HOURS *DO NOT DRIVE FOR 8 HOURS AFTER YOU TAKE MEDICINE* 21 tablet 0   cycloSPORINE (RESTASIS) 0.05 % ophthalmic emulsion Place 1 drop into both eyes 2 (two) times daily.     estradiol (VIVELLE-DOT) 0.1 MG/24HR patch Place 1 patch onto the skin 2 (two) times a week. Through [facility]     Multiple Vitamin (MULTIVITAMIN) tablet Take 1 tablet by mouth daily.     Probiotic Product (PHILLIPS COLON HEALTH PO) Take by mouth daily.     Psyllium (METAMUCIL FIBER PO) Take 1 each by mouth daily as needed. 1 tbs daily      tretinoin  (ALTRALIN) 0.05 % gel Apply 1 application  topically at bedtime. 45 g 3   No facility-administered medications prior to visit.     Review of Systems:   Constitutional:   No  weight loss, night sweats,  Fevers, chills, fatigue, or  lassitude.  HEENT:   No headaches,  Difficulty swallowing,  Tooth/dental problems, or  Sore throat,  No sneezing, itching, ear ache, nasal congestion, post nasal drip,   CV:  No chest pain,  Orthopnea, PND, swelling in lower extremities, anasarca, dizziness, palpitations, syncope.   GI  No heartburn, indigestion, abdominal pain, nausea, vomiting, diarrhea, change in bowel habits, loss of appetite, bloody stools.   Resp: No shortness of breath with exertion or at rest.  No excess mucus, no productive cough,  No non-productive cough,  No coughing up of blood.  No change in color of mucus.  No wheezing.  No chest wall deformity  Skin: no rash or lesions.  GU: no dysuria, change in color of urine, no urgency or frequency.  No flank pain, no  hematuria   MS:  No joint pain or swelling.  No decreased range of motion.  No back pain.    Physical Exam  BP (!) 97/49   Pulse (!) 55   Ht 5\' 8"  (1.727 m)   Wt 123 lb 12.8 oz (56.2 kg)   LMP  (LMP Unknown)   SpO2 99%   BMI 18.82 kg/m   GEN: A/Ox3; pleasant , NAD, well nourished    HEENT:  /AT,  NOSE-clear, THROAT-clear, no lesions, no postnasal drip or exudate noted. Class 2 MP airway   NECK:  Supple w/ fair ROM; no JVD; normal carotid impulses w/o bruits; no thyromegaly or nodules palpated; no lymphadenopathy.    RESP  Clear  P & A; w/o, wheezes/ rales/ or rhonchi. no accessory muscle use, no dullness to percussion  CARD:  RRR, no m/r/g, no peripheral edema, pulses intact, no cyanosis or clubbing.  GI:   Soft & nt; nml bowel sounds; no organomegaly or masses detected.   Musco: Warm bil, no deformities or joint swelling noted.   Neuro: alert, no focal deficits noted.    Skin: Warm, no lesions or rashes    Lab Results:  CBC    BNP No results found for: "BNP"  ProBNP No results found for: "PROBNP"  Imaging: No results found.  Administration History     None           No data to display          No results found for: "NITRICOXIDE"      Assessment & Plan:   Snoring Home sleep study does not show any significant sleep apnea.  We discussed her results in detail.  He does have snoring.  We discussed setting up for an in lab sleep study.  Patient declines at this time.  Does not feel like her symptoms are too burdensome.  Discussed healthy sleep regimen, positional sleep -sleeping with her head of her bed at an incline at 30 degrees.  Avoiding sleeping on her back. We also discussed an oral appliance. Plan Patient Instructions  Work on healthy sleep regimen  Sleep with head of bed at incline -30 degrees  Avoid sleeping on your back  Contact Dr. Ardell Koller if interested in the oral appliance for snoring [phone] Follow up in 6 weeks to  discuss results and treatment plan -Virtual or In person       Roena Clark, NP 01/18/2024

## 2024-01-18 NOTE — Assessment & Plan Note (Signed)
 Home sleep study does not show any significant sleep apnea.  We discussed her results in detail.  He does have snoring.  We discussed setting up for an in lab sleep study.  Patient declines at this time.  Does not feel like her symptoms are too burdensome.  Discussed healthy sleep regimen, positional sleep -sleeping with her head of her bed at an incline at 30 degrees.  Avoiding sleeping on her back. We also discussed an oral appliance. Plan Patient Instructions  Work on healthy sleep regimen  Sleep with head of bed at incline -30 degrees  Avoid sleeping on your back  Contact Dr. Ardell Koller if interested in the oral appliance for snoring [phone] Follow up in 6 weeks to discuss results and treatment plan -Virtual or In person

## 2024-04-11 ENCOUNTER — Ambulatory Visit (INDEPENDENT_AMBULATORY_CARE_PROVIDER_SITE_OTHER): Payer: BC Managed Care – PPO | Admitting: Family Medicine

## 2024-04-11 ENCOUNTER — Encounter: Payer: Self-pay | Admitting: Family Medicine

## 2024-04-11 VITALS — BP 100/62 | HR 69 | Temp 97.9°F | Ht 68.0 in | Wt 125.8 lb

## 2024-04-11 DIAGNOSIS — Z131 Encounter for screening for diabetes mellitus: Secondary | ICD-10-CM | POA: Diagnosis not present

## 2024-04-11 DIAGNOSIS — R739 Hyperglycemia, unspecified: Secondary | ICD-10-CM

## 2024-04-11 DIAGNOSIS — E785 Hyperlipidemia, unspecified: Secondary | ICD-10-CM

## 2024-04-11 DIAGNOSIS — D6859 Other primary thrombophilia: Secondary | ICD-10-CM | POA: Diagnosis not present

## 2024-04-11 DIAGNOSIS — M85851 Other specified disorders of bone density and structure, right thigh: Secondary | ICD-10-CM | POA: Diagnosis not present

## 2024-04-11 DIAGNOSIS — Z0000 Encounter for general adult medical examination without abnormal findings: Secondary | ICD-10-CM | POA: Diagnosis not present

## 2024-04-11 MED ORDER — TRETINOIN 0.05 % EX GEL: 1.0000 | Freq: Every day | CUTANEOUS | 3 refills | Status: AC

## 2024-04-11 NOTE — Patient Instructions (Addendum)
 Please stop by lab before you go If you have mychart- we will send your results within 3 business days of us  receiving them.  If you do not have mychart- we will call you about results within 5 business days of us  receiving them.  *please also note that you will see labs on mychart as soon as they post. I will later go in and write notes on them- will say notes from Dr. Katrinka   Consider fall flu and COVID shot. Also due for Prevnar 20- pneumonia shot. Can call back in a month or two and we should have available  Keep up the great job on all fronts!   Recommended follow up: Return for physical or sooner if needed.Schedule b4 you leave.

## 2024-04-11 NOTE — Progress Notes (Signed)
 Phone: [phone]   Subjective:  Patient presents today for their annual physical. Chief complaint-noted.   See problem oriented charting- ROS- full  review of systems was completed and negative  Per full ROS sheet completed by patient except for topics noted under acute/chronic concerns  The following were reviewed and entered/updated in epic: Past Medical History:  Diagnosis Date   Allergy    Tamiflu , seasonal   Clotting disorder (HCC)    protein S deficiency    History of D&C    miscarriage 2001   History of laparoscopy-assisted vaginal hysterectomy    Hx of LASIK    bilateral   Osteopenia    Varicose veins of both lower extremities    veins injected bilateral   Wisdom teeth extracted    Patient Active Problem List   Diagnosis Date Noted   Protein S deficiency (HCC) 09/17/2018    Priority: Medium    Schatzki's ring of distal esophagus 03/19/2020    Priority: Low   Osteopenia 07/20/2019    Priority: Low   PARESTHESIA 12/28/2007    Priority: Low   Snoring 12/03/2023   Lateral epicondylitis of right elbow 08/04/2022   Chronic maxillary sinusitis 08/04/2022   Past Surgical History:  Procedure Laterality Date   COLONOSCOPY  07/2008   polyps, 09/2017   DILATION AND CURETTAGE OF UTERUS  1999   miscarriage   POLYPECTOMY     REFRACTIVE SURGERY Bilateral 2007   Lasik   UPPER GASTROINTESTINAL ENDOSCOPY  08/23/2019   dysphagia   URETHRAL STRICTURE DILATATION  1978   in high school     Family History  Problem Relation Age of Onset   Prostate cancer Father        robotic surgery and free 10 years in 2020   Colon polyps Father    Other Father        complications after covid0 over 43   Stomach cancer Maternal Uncle    Fibromyalgia Mother    Arthritis Mother    Hyperlipidemia Mother    Hypertension Mother        may be father though   Hemochromatosis Mother    Other Mother        over 72- died 8 months after husband passed   Healthy Sister     Non-Hodgkin's lymphoma Brother        doing well with treatment   Clotting disorder Maternal Grandfather    Deep vein thrombosis Brother        didnt dissolve- possible IVC filter   Glaucoma Paternal Grandmother    Colon cancer Neg Hx    Rectal cancer Neg Hx    Esophageal cancer Neg Hx     Medications- reviewed and updated Current Outpatient Medications  Medication Sig Dispense Refill   aspirin 81 MG tablet Take 81 mg by mouth daily.     Calcium Carb-Cholecalciferol (CALCIUM 1000 + D) 1000-800 MG-UNIT TABS calcium  1200 mg     cyclobenzaprine  (FLEXERIL ) 5 MG tablet TAKE 1 TABLET BY MOUTH EVERY 8 HOURS *DO NOT DRIVE FOR 8 HOURS AFTER YOU TAKE MEDICINE* 21 tablet 0   cycloSPORINE (RESTASIS) 0.05 % ophthalmic emulsion Place 1 drop into both eyes 2 (two) times daily.     estradiol (VIVELLE-DOT) 0.1 MG/24HR patch Place 1 patch onto the skin 2 (two) times a week. Through [facility]     Multiple Vitamin (MULTIVITAMIN) tablet Take 1 tablet by mouth daily.     Probiotic Product (PHILLIPS COLON  HEALTH PO) Take by mouth daily.     Psyllium (METAMUCIL FIBER PO) Take 1 each by mouth daily as needed. 1 tbs daily      tretinoin  (ALTRALIN) 0.05 % gel Apply 1 application  topically at bedtime. 45 g 3   No current facility-administered medications for this visit.    Allergies-reviewed and updated Allergies  Allergen Reactions   Tamiflu [Oseltamivir Phosphate] Hives and Itching    Social History   Social History Narrative   Married with 66 year old adopted daughter Sherrilyn in January 2025- senior in hs - 3 sports vball, bball and soccer- will go to [employer]      Retired in 2024.    prior consulting still in HR>    Prior  [employer]       Hobbies: exercise, may volunteer at the hospital, travel, volunteer work at [employer]. [place]    Objective  Objective:  BP 100/62   Pulse 69   Temp 97.9 F (36.6 C)   Ht 5' 8 (1.727 m)   Wt 125 lb 12.8 oz (57.1 kg)   LMP  (LMP Unknown)    SpO2 98%   BMI 19.13 kg/m  Gen: NAD, resting comfortably HEENT: Mucous membranes are moist. Oropharynx normal Neck: no thyromegaly CV: RRR no murmurs rubs or gallops Lungs: CTAB no crackles, wheeze, rhonchi Abdomen: soft/nontender/nondistended/normal bowel sounds. No rebound or guarding.  Ext: no edema Skin: warm, dry Neuro: grossly normal, moves all extremities, PERRLA    Assessment and Plan  68 y.o. female presenting for annual physical.  Health Maintenance counseling: 1. Anticipatory guidance: Patient counseled regarding regular dental exams -q6 months, eye exams -yearly,  avoiding smoking and second hand smoke , limiting alcohol to 1 beverages per day - other than splurge on vacation- enjoyed wine in [place], no illicit drugs .   2. Risk factor reduction:  Advised patient of need for regular exercise and diet rich and fruits and vegetables to reduce risk of heart attack and stroke.  Exercise- remains very active with regular exercise-lifting weights, rowing, aerobics, stretching, yoga about an hour 5 days a week for 45 minutes plus some additional walking.  Diet/weight management-weight within 2 pounds last year-thankful for slight gain.  Wt Readings from Last 3 Encounters:  04/11/24 125 lb 12.8 oz (57.1 kg)  01/18/24 123 lb 12.8 oz (56.2 kg)  12/03/23 125 lb 6.4 oz (56.9 kg)  3. Immunizations/screenings/ancillary studies-recommended fall flu and COVID shot, otherwise up-to-date. Prevnar 20 may do with flu  Immunization History  Administered Date(s) Administered   Influenza Inj Mdck Quad Pf 05/27/2019   Influenza, High Dose Seasonal PF 07/24/2023   Influenza,inj,Quad PF,6+ Mos 06/11/2018, 06/03/2019, 07/15/2021, 07/18/2022   Moderna Covid-19 Vaccine Bivalent Booster 44yrs & up 07/18/2022   Moderna Sars-Covid-2 Vaccination 06/03/2021   PFIZER(Purple Top)SARS-COV-2 Vaccination 11/18/2019, 12/19/2019, 06/19/2020   Pfizer(Comirnaty)Fall Seasonal Vaccine 12 years and older 07/24/2023    Tdap 06/24/2017   Zoster Recombinant(Shingrix ) 09/21/2018, 01/11/2019  4. Cervical cancer screening- pap smear 11/08/19 with 3 year repeat planned-and reports completed last May and normal  5. Breast cancer screening-  breast exam with GYN and 3D mammogram 11/19/2023  6. Colon cancer screening -09/29/18 with 10 year repeat planned    7. Skin cancer screening-   with Dr. Bonney  q3 years- just seen this year.  advised regular sunscreen use. Denies worrisome, changing, or new skin lesions.  8. Birth control/STD check- postmenopausal and monogamous   9. Osteoporosis screening at 65- DEXA August 17, 2023 with [facility] with worst to score -1.6. reports had mild improvement- started working with weights  -never smoker    Status of chronic or acute concerns   #social update- Sherrilyn will be headed to [facility] in the fall  # Snoring-Home sleep test without significant sleep apnea.  Consideration of in-lab sleep study but holding off for now.  Some tips to help avoiding snoring were given.  Given information about oral appliance.    # Protein S deficiency S: No history of DVT or PE.  Remains on aspirin long-term.  No edema or shortness of breath. -No issues even with estradiol  A/P: no issues- continue current medications    #hyperlipidemia S: Medication:none  The 10-year ASCVD risk score (Arnett DK, et al., 2019) is: 2.8% - no coronary artery disease in family Lab Results  Component Value Date   CHOL 187 12/18/2022   HDL 82.70 12/18/2022   LDLCALC 94 12/18/2022   TRIG 51.0 12/18/2022   CHOLHDL 2 12/18/2022   A/P: lipids mildly above ideal goal but arteriosclerotic cardiovascular disease risk is low enoguh that we can hold off on further workup as long as stable   # Hyperglycemia/prediabetes-sugars elevated but A1c's have been controlled-recheck with labs.    # Hearing loss-was 1 to get tested at [facility] last year-had some left ear issue as well of itching. Has not done  testing yet- considering.   - went to [place] and right ear bled. Also had bloody nose.   # Rosacea/facial bumps-previously saw [facility] and was given tretinoin -refilled last year   # Upper neck tension-gave name of Dr. Claudene last year but she has held off- realized looking at her phone was issues- cutting back and stretching more -leave flexeril  on list in case needed  Recommended follow up: Return for physical or sooner if needed.Schedule b4 you leave.  Lab/Order associations:NOT fasting   ICD-10-CM   1. Preventative health care  Z00.00     2. Protein S deficiency (HCC)  D68.59     3. Osteopenia of neck of right femur  M85.851     4. Mild hyperlipidemia  E78.5 Comprehensive metabolic panel with GFR    CBC with Differential/Platelet    Lipid panel    5. Hyperglycemia  R73.9 Hemoglobin A1c    6. Screening for diabetes mellitus  Z13.1 Hemoglobin A1c      Meds ordered this encounter  Medications   tretinoin  (ALTRALIN) 0.05 % gel    Sig: Apply 1 application  topically at bedtime.    Dispense:  45 g    Refill:  3    Return precautions advised.  Garnette Lukes, MD

## 2024-04-12 ENCOUNTER — Ambulatory Visit: Payer: Self-pay | Admitting: Family Medicine

## 2024-04-12 LAB — COMPREHENSIVE METABOLIC PANEL WITH GFR
ALT: 14 U/L (ref 0–35)
AST: 19 U/L (ref 0–37)
Albumin: 4 g/dL (ref 3.5–5.2)
Alkaline Phosphatase: 38 U/L — ABNORMAL LOW (ref 39–117)
BUN: 16 mg/dL (ref 6–23)
CO2: 33 meq/L — ABNORMAL HIGH (ref 19–32)
Calcium: 9 mg/dL (ref 8.4–10.5)
Chloride: 100 meq/L (ref 96–112)
Creatinine, Ser: 0.78 mg/dL (ref 0.40–1.20)
GFR: 79.56 mL/min (ref >60.00)
Glucose, Bld: 88 mg/dL (ref 70–99)
Potassium: 3.8 meq/L (ref 3.5–5.1)
Sodium: 139 meq/L (ref 135–145)
Total Bilirubin: 0.4 mg/dL (ref 0.2–1.2)
Total Protein: 6.3 g/dL (ref 6.0–8.3)

## 2024-04-12 LAB — CBC WITH DIFFERENTIAL/PLATELET
Basophils Absolute: 0.1 K/uL (ref 0.0–0.1)
Basophils Relative: 1 % (ref 0.0–3.0)
Eosinophils Absolute: 0.1 K/uL (ref 0.0–0.7)
Eosinophils Relative: 1.9 % (ref 0.0–5.0)
HCT: 39.2 % (ref 36.0–46.0)
Hemoglobin: 13.4 g/dL (ref 12.0–15.0)
Lymphocytes Relative: 20.3 % (ref 12.0–46.0)
Lymphs Abs: 1.1 K/uL (ref 0.7–4.0)
MCHC: 34.1 g/dL (ref 30.0–36.0)
MCV: 92.5 fl (ref 78.0–100.0)
Monocytes Absolute: 0.3 K/uL (ref 0.1–1.0)
Monocytes Relative: 5.3 % (ref 3.0–12.0)
Neutro Abs: 3.7 K/uL (ref 1.4–7.7)
Neutrophils Relative %: 71.5 % (ref 43.0–77.0)
Platelets: 156 K/uL (ref 150.0–400.0)
RBC: 4.24 Mil/uL (ref 3.87–5.11)
RDW: 13.5 % (ref 11.5–15.5)
WBC: 5.2 K/uL (ref 4.0–10.5)

## 2024-04-12 LAB — LIPID PANEL
Cholesterol: 179 mg/dL (ref 0–200)
HDL: 73.1 mg/dL (ref >39.00)
LDL Cholesterol: 88 mg/dL (ref 0–99)
NonHDL: 105.5
Total CHOL/HDL Ratio: 2
Triglycerides: 86 mg/dL (ref 0.0–149.0)
VLDL: 17.2 mg/dL (ref 0.0–40.0)

## 2024-04-12 LAB — HEMOGLOBIN A1C: Hgb A1c MFr Bld: 5.5 % (ref 4.6–6.5)

## 2024-05-28 ENCOUNTER — Encounter: Payer: Self-pay | Admitting: Family Medicine

## 2024-06-17 ENCOUNTER — Ambulatory Visit: Payer: Self-pay

## 2024-06-17 ENCOUNTER — Encounter: Payer: Self-pay | Admitting: Family Medicine

## 2024-06-17 ENCOUNTER — Ambulatory Visit: Admitting: Family Medicine

## 2024-06-17 VITALS — BP 98/60 | HR 68 | Temp 98.6°F | Wt 125.7 lb

## 2024-06-17 DIAGNOSIS — R590 Localized enlarged lymph nodes: Secondary | ICD-10-CM | POA: Diagnosis not present

## 2024-06-17 NOTE — Telephone Encounter (Signed)
 FYI Only or Action Required?: FYI only for provider.  Patient was last seen in primary care on 04/11/2024 by Katrinka Garnette KIDD, MD.  Called Nurse Triage reporting Groin Swelling.  Symptoms began several days ago.  Interventions attempted: Nothing.  Symptoms are: gradually worsening.  Triage Disposition: See Physician Within 24 Hours  Patient/caregiver understands and will follow disposition?: Yes   Copied from CRM [id number]. Topic: Clinical - Red Word Triage >> Jun 17, 2024  8:18 AM Mercedes MATSU wrote: Red Word that prompted transfer to Nurse Triage: Right side swelling to the groin area, swelling and tender. Patient was working out days prior and she said it did feel strange. Patient also stated that she was having chills which was odd for her. Reason for Disposition  [1] Single large node AND [2] size > 1 inch (2.5 cm) AND [3] no fever  Answer Assessment - Initial Assessment Questions 1. LOCATION: Where is the swollen node located? Is the matching node on the other side of the body also swollen?      Swollen in the Groin area  2. SIZE: How big is the node? (e.g., inches or centimeters; or compared to common objects such as pea, bean, marble, golf ball)      [place]  3. ONSET: When did the swelling start?      Tuesday  4. NECK NODES: Is there a sore throat, runny nose or other symptoms of a cold?      No  5. GROIN OR ARMPIT NODES: Is there a sore, scratch, cut or painful red area on that arm or leg?      Groin, Tender  6. FEVER: Do you have a fever? If Yes, ask: What is it, how was it measured, and when did it start?      Unsure  7. CAUSE: What do you think is causing the swollen lymph nodes?     Unsure  8. OTHER SYMPTOMS: Do you have any other symptoms? (e.g., node is tender to touch, skin redness over node, weight changes)     Chills. Tenderness  9. PREGNANCY: Is there any chance you are pregnant? When was your last menstrual period?     No and  No  Protocols used: Lymph Nodes - Swollen-A-AH

## 2024-06-17 NOTE — Patient Instructions (Signed)
 Suspect probably reactive lymph node - inguinal region.  Follow up with Dr Katrinka for any persistent (> 3 weeks) or increase in size.

## 2024-06-17 NOTE — Telephone Encounter (Signed)
 Patient scheduled for acute visit today at [facility]. Dr. Katrinka will review triage notes.

## 2024-06-17 NOTE — Progress Notes (Signed)
 Established Patient Office Visit  Subjective   Patient ID: Stacy Baker, female    DOB: 1955-09-23  Age: 68 y.o. MRN: [id number]  Chief Complaint  Patient presents with   Hernia    HPI   Stacy Baker is seen today as a work in with question of hernia right inguinal region.  She stays very active and is very health-conscious.  Last Tuesday she did prickly hard workout with a lot of abdominal exercises.  By Wednesday she noticed some diffuse muscle aches but no fever.  No cough or congestion.  She had some chills Thursday and wonders she may have been have a viral syndrome.  Took some NyQuil Thursday night.  Overall, feels somewhat better today but still has some mild swelling right inguinal region and soreness in this region as well.  Has not noted any other areas of lymphadenopathy.  No recent right lower extremity rashes or skin lesions.  Appetite and weight stable.  Past Medical History:  Diagnosis Date   Allergy    Tamiflu , seasonal   Clotting disorder    protein S deficiency    History of D&C    miscarriage 2001   History of laparoscopy-assisted vaginal hysterectomy    Hx of LASIK    bilateral   Osteopenia    Varicose veins of both lower extremities    veins injected bilateral   Wisdom teeth extracted    Past Surgical History:  Procedure Laterality Date   COLONOSCOPY  07/2008   polyps, 09/2017   DILATION AND CURETTAGE OF UTERUS  1999   miscarriage   POLYPECTOMY     REFRACTIVE SURGERY Bilateral 2007   Lasik   UPPER GASTROINTESTINAL ENDOSCOPY  08/23/2019   dysphagia   URETHRAL STRICTURE DILATATION  1978   in high school     reports that she has never smoked. She has never used smokeless tobacco. She reports current alcohol use of about 7.0 standard drinks of alcohol per week. She reports that she does not use drugs. family history includes Arthritis in her mother; Clotting disorder in her maternal grandfather; Colon polyps in her father; Deep vein  thrombosis in her brother; Fibromyalgia in her mother; Glaucoma in her paternal grandmother; Healthy in her sister; Hemochromatosis in her mother; Hyperlipidemia in her mother; Hypertension in her mother; Non-Hodgkin's lymphoma in her brother; Other in her father and mother; Prostate cancer in her father; Stomach cancer in her maternal uncle. Allergies  Allergen Reactions   Tamiflu [Oseltamivir Phosphate] Hives and Itching    Review of Systems  Constitutional:  Negative for weight loss.       See HPI  HENT:  Negative for congestion and sore throat.   Respiratory:  Negative for cough.   Cardiovascular:  Negative for chest pain.  Gastrointestinal:  Negative for abdominal pain.      Objective:     BP 98/60   Pulse 68   Temp 98.6 F (37 C) (Oral)   Wt 125 lb 11.2 oz (57 kg)   LMP  (LMP Unknown)   SpO2 97%   BMI 19.11 kg/m  BP Readings from Last 3 Encounters:  06/17/24 98/60  04/11/24 100/62  01/18/24 (!) 97/49   Wt Readings from Last 3 Encounters:  06/17/24 125 lb 11.2 oz (57 kg)  04/11/24 125 lb 12.8 oz (57.1 kg)  01/18/24 123 lb 12.8 oz (56.2 kg)      Physical Exam Vitals reviewed.  Constitutional:      General: She is not  in acute distress.    Appearance: She is not ill-appearing.  Cardiovascular:     Rate and Rhythm: Normal rate and regular rhythm.  Pulmonary:     Effort: Pulmonary effort is normal.     Breath sounds: Normal breath sounds. No wheezing or rales.  Genitourinary:    Comments: Right inguinal region examined with nurse present.  No evidence for hernia.  She does have tender oblong shaped approximately 1 x 1-1/2 cm inguinal node which is fairly firm to palpation.  No fluctuance.  Has a couple smaller lymph nodes right and left inguinal chain  No cervical or supraclavicular adenopathy noted Musculoskeletal:     Cervical back: Neck supple.  Lymphadenopathy:     Cervical: No cervical adenopathy.  Neurological:     Mental Status: She is alert.       No results found for any visits on 06/17/24.    The 10-year ASCVD risk score (Arnett DK, et al., 2019) is: 2.8%    Assessment & Plan:   Tender right inguinal lymph node.  Just came up acutely this week.  No evidence for hernia on exam.  Suspect reactive node.  Recommend close observation.  If this is not going down in size or specially if increasing over the next few weeks follow-up with primary to reassess  Wolm Scarlet, MD

## 2024-06-20 ENCOUNTER — Ambulatory Visit: Admitting: Family Medicine

## 2024-06-20 ENCOUNTER — Encounter: Payer: Self-pay | Admitting: Family Medicine

## 2024-06-20 ENCOUNTER — Ambulatory Visit: Payer: Self-pay | Admitting: Family Medicine

## 2024-06-20 VITALS — BP 98/68 | HR 58 | Temp 98.6°F | Ht 68.0 in | Wt 126.0 lb

## 2024-06-20 DIAGNOSIS — R5383 Other fatigue: Secondary | ICD-10-CM | POA: Diagnosis not present

## 2024-06-20 DIAGNOSIS — R002 Palpitations: Secondary | ICD-10-CM

## 2024-06-20 LAB — CBC WITH DIFFERENTIAL/PLATELET
Basophils Absolute: 0 K/uL (ref 0.0–0.1)
Basophils Relative: 0.9 % (ref 0.0–3.0)
Eosinophils Absolute: 0.1 K/uL (ref 0.0–0.7)
Eosinophils Relative: 2 % (ref 0.0–5.0)
HCT: 39.6 % (ref 36.0–46.0)
Hemoglobin: 13.2 g/dL (ref 12.0–15.0)
Lymphocytes Relative: 29.2 % (ref 12.0–46.0)
Lymphs Abs: 0.9 K/uL (ref 0.7–4.0)
MCHC: 33.3 g/dL (ref 30.0–36.0)
MCV: 92.1 fl (ref 78.0–100.0)
Monocytes Absolute: 0.3 K/uL (ref 0.1–1.0)
Monocytes Relative: 10 % (ref 3.0–12.0)
Neutro Abs: 1.7 K/uL (ref 1.4–7.7)
Neutrophils Relative %: 57.9 % (ref 43.0–77.0)
Platelets: 144 K/uL — ABNORMAL LOW (ref 150.0–400.0)
RBC: 4.3 Mil/uL (ref 3.87–5.11)
RDW: 13.2 % (ref 11.5–15.5)
WBC: 3 K/uL — ABNORMAL LOW (ref 4.0–10.5)

## 2024-06-20 LAB — COMPREHENSIVE METABOLIC PANEL WITH GFR
ALT: 18 U/L (ref 0–35)
AST: 23 U/L (ref 0–37)
Albumin: 4.3 g/dL (ref 3.5–5.2)
Alkaline Phosphatase: 46 U/L (ref 39–117)
BUN: 11 mg/dL (ref 6–23)
CO2: 32 meq/L (ref 19–32)
Calcium: 9.1 mg/dL (ref 8.4–10.5)
Chloride: 101 meq/L (ref 96–112)
Creatinine, Ser: 0.83 mg/dL (ref 0.40–1.20)
GFR: 73.74 mL/min (ref >60.00)
Glucose, Bld: 106 mg/dL — ABNORMAL HIGH (ref 70–99)
Potassium: 4.3 meq/L (ref 3.5–5.1)
Sodium: 139 meq/L (ref 135–145)
Total Bilirubin: 0.4 mg/dL (ref 0.2–1.2)
Total Protein: 6.3 g/dL (ref 6.0–8.3)

## 2024-06-20 LAB — TSH: TSH: 1.35 u[IU]/mL (ref 0.35–5.50)

## 2024-06-20 NOTE — Progress Notes (Signed)
 Waddell Waddell is about to come Phone [phone] In person visit   Subjective:   Stacy Baker is a 68 y.o. year old very pleasant female patient who presents for/with See problem oriented charting Chief Complaint  Patient presents with   Heart Problem    Racing heartbeat and she can feel her heartbeat and she's never been able to feel it before; this started 2 weeks ago and it is not as frequent now maybe once a day;    swollen lymphnode    Swelling began last Wednesday and it is painful to the touch; located in the groin; she thinks its a hernia;    low energy    X1 month;     Past Medical History-  Patient Active Problem List   Diagnosis Date Noted   Protein S deficiency 09/17/2018    Priority: Medium    Schatzki's ring of distal esophagus 03/19/2020    Priority: Low   Osteopenia 07/20/2019    Priority: Low   PARESTHESIA 12/28/2007    Priority: Low   Snoring 12/03/2023   Lateral epicondylitis of right elbow 08/04/2022   Chronic maxillary sinusitis 08/04/2022    Medications- reviewed and updated Current Outpatient Medications  Medication Sig Dispense Refill   aspirin 81 MG tablet Take 81 mg by mouth daily.     Calcium Carb-Cholecalciferol (CALCIUM 1000 + D) 1000-800 MG-UNIT TABS calcium  1200 mg     cyclobenzaprine  (FLEXERIL ) 5 MG tablet TAKE 1 TABLET BY MOUTH EVERY 8 HOURS *DO NOT DRIVE FOR 8 HOURS AFTER YOU TAKE MEDICINE* 21 tablet 0   cycloSPORINE (RESTASIS) 0.05 % ophthalmic emulsion Place 1 drop into both eyes 2 (two) times daily.     estradiol (VIVELLE-DOT) 0.1 MG/24HR patch Place 1 patch onto the skin 2 (two) times a week. Through [facility]     Multiple Vitamin (MULTIVITAMIN) tablet Take 1 tablet by mouth daily.     Probiotic Product (PHILLIPS COLON HEALTH PO) Take by mouth daily.     Psyllium (METAMUCIL FIBER PO) Take 1 each by mouth daily as needed. 1 tbs daily      tretinoin  (ALTRALIN) 0.05 % gel Apply 1 application  topically at  bedtime. 45 g 3   No current facility-administered medications for this visit.     Objective:  BP 98/68 (BP Location: Left Arm, Patient Position: Sitting, Cuff Size: Normal)   Pulse (!) 58   Temp 98.6 F (37 C) (Temporal)   Ht 5' 8 (1.727 m)   Wt 126 lb (57.2 kg)   LMP  (LMP Unknown)   SpO2 94%   BMI 19.16 kg/m  Gen: NAD, resting comfortably Pharynx with mild erythema and drainage, nasal turbinates normal CV: RRR no murmurs rubs or gallops Lungs: CTAB no crackles, wheeze, rhonchi Abdomen: soft/nontender/nondistended/normal bowel sounds. No rebound or guarding.  Ext: no edema Skin: warm, dry Neuro: grossly normal, moves all extremities  EKG: sinus bradycardia rhythm with rate 54, normal axis, normal intervals, no hypertrophy, no st or t wave changes     Assessment and Plan   # Palpitations # Fatigue # Groin lymphadenopathy S: Symptoms started 2-3 weeks ago.  Racing heartbeat and she can feel her heartbeat which has never happened in the past- maybe mild heaviness and flutter infrequently- was once a day just for a few seconds.  The frequency is thankfully slowing down to perhaps once every other day now in the last week.    She is also had low energy  over the last month. Got back from [place] in late August and fatigue started sometime after that. Low level fatigue and not like herself.   Then a week ago on Tuesday night felt fatigued even more so, back ache, exhaustion and complete body soreness- felt like when had h1n1 in past- bad headache- lasted about 2 days- never had fever (chance of COVID). Then noted swollen lymph node. She also noted some swelling of lymph nodes starting last Wednesday painful to the touch in the groin.  She saw Dr. Micheal on the 10th for this specific issue-he thought likely reactive but advised to follow-up if not improving  Feels better today in general. Brother with non hodkins lymphoma so makes her nervous.  A/P: Patient with fatigue for  approximately a month-check TSH, CBC, CMP.  No history of vitamin deficiencies and will hold off on further lab test for now  Patient also with palpitations for 2 to 3 weeks-labs appropriate for this as well.  EKG largely reassuring.  I am going to send her message to see if she is interested in doing cardiac monitoring -Discussed echocardiogram but likely hold on that until we get potential cardiac monitoring back - With slight chest pressure also offered D-dimer with her protein S deficiency history but also discussed could be positive from recent illness and False positive-she was hold off on that for now  Sounds like she had a viral illness possibly even COVID last week and does have lymphadenopathy in her right groin about 1.5 x 1.5 cm-we discussed if this further worsens or fails to improve in the next 2 to 3 weeks would like to refer her to general surgeon.  Family history of lymphoma  We did discuss new or worsening symptoms to let us  know ASAP   Hold off on immunizations with recent symptoms for now  Recommended follow up: No follow-ups on file. Future Appointments  Date Time Provider Department Center  04/13/2025  2:00 PM Katrinka Garnette KIDD, MD [name]    Lab/Order associations:   ICD-10-CM   1. Palpitations  R00.2 Comprehensive metabolic panel with GFR    CBC with Differential/Platelet    TSH    EKG 12-Lead    2. Fatigue, unspecified type  R53.83 Comprehensive metabolic panel with GFR    CBC with Differential/Platelet    TSH    EKG 12-Lead      I personally spent a total of 25 minutes in the care of the patient today including preparing to see the patient, getting/reviewing separately obtained history, performing a medically appropriate exam/evaluation, counseling and educating, placing orders, and documenting clinical information in the EHR -Additional 3 minutes spent on EKG interpretation.    Return precautions advised.  Garnette Katrinka, MD

## 2024-06-20 NOTE — Patient Instructions (Addendum)
 Please stop by lab before you go If you have mychart- we will send your results within 3 business days of us  receiving them.  If you do not have mychart- we will call you about results within 5 business days of us  receiving them.  *please also note that you will see labs on mychart as soon as they post. I will later go in and write notes on them- will say notes from Dr. Katrinka Atlas start workup with labs and EKG and consider cardiac monitoring  Recommended follow up: No follow-ups on file.

## 2024-07-11 ENCOUNTER — Other Ambulatory Visit (INDEPENDENT_AMBULATORY_CARE_PROVIDER_SITE_OTHER)

## 2024-07-11 ENCOUNTER — Ambulatory Visit: Payer: Self-pay | Admitting: Family Medicine

## 2024-07-11 ENCOUNTER — Other Ambulatory Visit: Payer: Self-pay | Admitting: Family Medicine

## 2024-07-11 ENCOUNTER — Encounter: Payer: Self-pay | Admitting: Family Medicine

## 2024-07-11 DIAGNOSIS — D72819 Decreased white blood cell count, unspecified: Secondary | ICD-10-CM

## 2024-07-11 LAB — CBC WITH DIFFERENTIAL/PLATELET
Basophils Absolute: 0 K/uL (ref 0.0–0.1)
Basophils Relative: 0.7 % (ref 0.0–3.0)
Eosinophils Absolute: 0.1 K/uL (ref 0.0–0.7)
Eosinophils Relative: 2.3 % (ref 0.0–5.0)
HCT: 38.2 % (ref 36.0–46.0)
Hemoglobin: 12.9 g/dL (ref 12.0–15.0)
Lymphocytes Relative: 26.6 % (ref 12.0–46.0)
Lymphs Abs: 1.1 K/uL (ref 0.7–4.0)
MCHC: 33.8 g/dL (ref 30.0–36.0)
MCV: 93 fl (ref 78.0–100.0)
Monocytes Absolute: 0.3 K/uL (ref 0.1–1.0)
Monocytes Relative: 6.1 % (ref 3.0–12.0)
Neutro Abs: 2.7 K/uL (ref 1.4–7.7)
Neutrophils Relative %: 64.3 % (ref 43.0–77.0)
Platelets: 150 K/uL (ref 150.0–400.0)
RBC: 4.11 Mil/uL (ref 3.87–5.11)
RDW: 13 % (ref 11.5–15.5)
WBC: 4.3 K/uL (ref 4.0–10.5)

## 2024-08-09 ENCOUNTER — Encounter: Payer: Self-pay | Admitting: Family Medicine

## 2024-09-21 ENCOUNTER — Encounter: Payer: Self-pay | Admitting: Family Medicine

## 2024-10-05 ENCOUNTER — Other Ambulatory Visit: Payer: Self-pay | Admitting: Family Medicine

## 2024-10-05 ENCOUNTER — Encounter: Payer: Self-pay | Admitting: Family Medicine

## 2024-10-05 DIAGNOSIS — Z1231 Encounter for screening mammogram for malignant neoplasm of breast: Secondary | ICD-10-CM

## 2024-10-06 ENCOUNTER — Other Ambulatory Visit: Payer: Self-pay | Admitting: Family Medicine

## 2024-10-06 ENCOUNTER — Telehealth: Payer: Self-pay | Admitting: Family Medicine

## 2024-10-06 DIAGNOSIS — N644 Mastodynia: Secondary | ICD-10-CM

## 2024-10-06 NOTE — Telephone Encounter (Signed)
 Orders placed.

## 2024-10-06 NOTE — Telephone Encounter (Signed)
 You may order diagnostic mammogram of right breast under right breast soreness as well as ultrasound

## 2024-10-06 NOTE — Telephone Encounter (Signed)
 Okay to send order or does she need to be seen in our office first.   Copied from CRM #[id number]. Topic: General - Other >> Oct 05, 2024  4:08 PM Stacy Baker wrote: Reason for CRM: [facility] calling to inform clinic that the patient, contacted the breast center for a mammogram and mentioned concerns of soreness in right breast . So the patient will need a diagnostic mammogram and US . And the Breast center needs to know how to proceed and if the patient needs a referral or can go ahead and schedule. Please return call to  [phone]

## 2024-10-12 ENCOUNTER — Ambulatory Visit

## 2024-10-12 ENCOUNTER — Ambulatory Visit
Admission: RE | Admit: 2024-10-12 | Discharge: 2024-10-12 | Disposition: A | Source: Ambulatory Visit | Attending: Family Medicine | Admitting: Family Medicine

## 2024-10-12 DIAGNOSIS — N644 Mastodynia: Secondary | ICD-10-CM

## 2025-04-13 ENCOUNTER — Encounter: Admitting: Family Medicine
